# Patient Record
Sex: Male | Born: 1970 | Race: White | Hispanic: No | Marital: Single | Smoking: Current every day smoker
Health system: Southern US, Academic
[De-identification: ages and names within clinical notes are randomized; demographics above are authoritative.]

## PROBLEM LIST (undated history)

## (undated) DIAGNOSIS — G9340 Encephalopathy, unspecified: Secondary | ICD-10-CM

## (undated) DIAGNOSIS — F329 Major depressive disorder, single episode, unspecified: Secondary | ICD-10-CM

## (undated) DIAGNOSIS — B20 Human immunodeficiency virus [HIV] disease: Principal | ICD-10-CM

## (undated) DIAGNOSIS — Z21 Asymptomatic human immunodeficiency virus [HIV] infection status: Secondary | ICD-10-CM

## (undated) DIAGNOSIS — F32A Depression, unspecified: Secondary | ICD-10-CM

## (undated) DIAGNOSIS — F1990 Other psychoactive substance use, unspecified, uncomplicated: Secondary | ICD-10-CM

## (undated) HISTORY — PX: HX HERNIA REPAIR: SHX51

## (undated) HISTORY — DX: Human immunodeficiency virus (HIV) disease: B20

## (undated) HISTORY — PX: APPENDECTOMY: SHX54

## (undated) HISTORY — DX: Encephalopathy, unspecified: G93.40

## (undated) HISTORY — DX: Major depressive disorder, single episode, unspecified: F32.9

## (undated) HISTORY — DX: Asymptomatic human immunodeficiency virus (hiv) infection status: Z21

## (undated) HISTORY — DX: Depression, unspecified: F32.A

---
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
## 1997-12-07 ENCOUNTER — Emergency Department (HOSPITAL_COMMUNITY): Admission: EM | Admit: 1997-12-07 | Discharge: 1997-12-07 | Payer: Self-pay | Admitting: Emergency Medicine

## 1998-11-16 ENCOUNTER — Emergency Department (HOSPITAL_COMMUNITY): Admission: EM | Admit: 1998-11-16 | Discharge: 1998-11-16 | Payer: Self-pay | Admitting: Emergency Medicine

## 2000-06-18 ENCOUNTER — Emergency Department (HOSPITAL_COMMUNITY): Admission: EM | Admit: 2000-06-18 | Discharge: 2000-06-19 | Payer: Self-pay | Admitting: Emergency Medicine

## 2000-06-18 ENCOUNTER — Encounter: Payer: Self-pay | Admitting: Emergency Medicine

## 2004-06-22 ENCOUNTER — Ambulatory Visit (HOSPITAL_COMMUNITY): Admission: RE | Admit: 2004-06-22 | Discharge: 2004-06-22 | Payer: Self-pay | Admitting: Infectious Diseases

## 2004-06-22 ENCOUNTER — Ambulatory Visit: Payer: Self-pay | Admitting: Infectious Diseases

## 2004-06-29 ENCOUNTER — Ambulatory Visit: Payer: Self-pay | Admitting: Infectious Diseases

## 2004-07-02 ENCOUNTER — Ambulatory Visit: Payer: Self-pay | Admitting: Infectious Diseases

## 2004-07-10 ENCOUNTER — Ambulatory Visit: Payer: Self-pay | Admitting: Infectious Diseases

## 2004-07-13 ENCOUNTER — Inpatient Hospital Stay (HOSPITAL_COMMUNITY): Admission: EM | Admit: 2004-07-13 | Discharge: 2004-07-27 | Payer: Self-pay | Admitting: Emergency Medicine

## 2004-07-13 ENCOUNTER — Ambulatory Visit: Payer: Self-pay | Admitting: Internal Medicine

## 2004-07-13 ENCOUNTER — Ambulatory Visit: Payer: Self-pay | Admitting: Infectious Diseases

## 2004-07-19 ENCOUNTER — Ambulatory Visit: Payer: Self-pay | Admitting: Infectious Diseases

## 2004-08-10 ENCOUNTER — Ambulatory Visit: Payer: Self-pay | Admitting: Infectious Diseases

## 2004-08-12 ENCOUNTER — Ambulatory Visit (HOSPITAL_COMMUNITY): Admission: RE | Admit: 2004-08-12 | Discharge: 2004-08-12 | Payer: Self-pay | Admitting: Infectious Diseases

## 2004-08-28 ENCOUNTER — Encounter: Admission: RE | Admit: 2004-08-28 | Discharge: 2004-08-28 | Payer: Self-pay | Admitting: Otolaryngology

## 2004-08-31 ENCOUNTER — Ambulatory Visit: Payer: Self-pay | Admitting: Infectious Diseases

## 2004-09-01 ENCOUNTER — Other Ambulatory Visit: Admission: RE | Admit: 2004-09-01 | Discharge: 2004-09-01 | Payer: Self-pay | Admitting: Otolaryngology

## 2004-09-09 ENCOUNTER — Ambulatory Visit: Payer: Self-pay | Admitting: Infectious Diseases

## 2004-09-16 ENCOUNTER — Observation Stay (HOSPITAL_COMMUNITY): Admission: EM | Admit: 2004-09-16 | Discharge: 2004-09-17 | Payer: Self-pay | Admitting: Emergency Medicine

## 2004-09-16 ENCOUNTER — Ambulatory Visit: Payer: Self-pay | Admitting: Infectious Diseases

## 2004-09-17 ENCOUNTER — Encounter (INDEPENDENT_AMBULATORY_CARE_PROVIDER_SITE_OTHER): Payer: Self-pay | Admitting: Specialist

## 2004-10-14 ENCOUNTER — Ambulatory Visit (HOSPITAL_COMMUNITY): Admission: RE | Admit: 2004-10-14 | Discharge: 2004-10-14 | Payer: Self-pay | Admitting: Infectious Diseases

## 2004-10-14 ENCOUNTER — Ambulatory Visit: Payer: Self-pay | Admitting: Infectious Diseases

## 2004-10-19 ENCOUNTER — Encounter: Admission: RE | Admit: 2004-10-19 | Discharge: 2004-10-19 | Payer: Self-pay | Admitting: Otolaryngology

## 2004-11-23 ENCOUNTER — Ambulatory Visit: Payer: Self-pay | Admitting: Infectious Diseases

## 2005-03-10 ENCOUNTER — Ambulatory Visit (HOSPITAL_COMMUNITY): Admission: RE | Admit: 2005-03-10 | Discharge: 2005-03-10 | Payer: Self-pay | Admitting: Infectious Diseases

## 2005-03-10 ENCOUNTER — Ambulatory Visit: Payer: Self-pay | Admitting: Infectious Diseases

## 2005-03-24 ENCOUNTER — Ambulatory Visit: Payer: Self-pay | Admitting: Infectious Diseases

## 2005-06-22 ENCOUNTER — Ambulatory Visit (HOSPITAL_COMMUNITY): Admission: RE | Admit: 2005-06-22 | Discharge: 2005-06-22 | Payer: Self-pay | Admitting: Infectious Diseases

## 2005-06-22 ENCOUNTER — Ambulatory Visit: Payer: Self-pay | Admitting: Infectious Diseases

## 2005-07-05 ENCOUNTER — Ambulatory Visit: Payer: Self-pay | Admitting: Infectious Diseases

## 2005-09-30 ENCOUNTER — Encounter (INDEPENDENT_AMBULATORY_CARE_PROVIDER_SITE_OTHER): Payer: Self-pay | Admitting: *Deleted

## 2005-09-30 ENCOUNTER — Ambulatory Visit: Payer: Self-pay | Admitting: Infectious Diseases

## 2005-09-30 ENCOUNTER — Encounter: Admission: RE | Admit: 2005-09-30 | Discharge: 2005-09-30 | Payer: Self-pay | Admitting: Infectious Diseases

## 2005-09-30 LAB — CONVERTED CEMR LAB
CD4 Count: 100 microliters
HIV 1 RNA Quant: 49 copies/mL

## 2005-10-25 ENCOUNTER — Ambulatory Visit: Payer: Self-pay | Admitting: Infectious Diseases

## 2005-11-17 ENCOUNTER — Ambulatory Visit: Payer: Self-pay | Admitting: Infectious Diseases

## 2005-11-17 ENCOUNTER — Ambulatory Visit (HOSPITAL_COMMUNITY): Admission: RE | Admit: 2005-11-17 | Discharge: 2005-11-17 | Payer: Self-pay | Admitting: Infectious Diseases

## 2005-11-24 ENCOUNTER — Ambulatory Visit: Payer: Self-pay | Admitting: Infectious Diseases

## 2005-11-26 ENCOUNTER — Ambulatory Visit (HOSPITAL_COMMUNITY): Admission: RE | Admit: 2005-11-26 | Discharge: 2005-11-26 | Payer: Self-pay | Admitting: Infectious Diseases

## 2005-12-01 ENCOUNTER — Ambulatory Visit: Payer: Self-pay | Admitting: Infectious Diseases

## 2006-01-10 ENCOUNTER — Encounter (INDEPENDENT_AMBULATORY_CARE_PROVIDER_SITE_OTHER): Payer: Self-pay | Admitting: *Deleted

## 2006-01-10 ENCOUNTER — Encounter: Admission: RE | Admit: 2006-01-10 | Discharge: 2006-01-10 | Payer: Self-pay | Admitting: Infectious Diseases

## 2006-01-10 ENCOUNTER — Ambulatory Visit: Payer: Self-pay | Admitting: Infectious Diseases

## 2006-01-10 LAB — CONVERTED CEMR LAB
CD4 Count: 160 microliters
HIV 1 RNA Quant: 49 copies/mL

## 2006-01-27 ENCOUNTER — Ambulatory Visit: Payer: Self-pay | Admitting: Infectious Diseases

## 2006-06-06 ENCOUNTER — Encounter (INDEPENDENT_AMBULATORY_CARE_PROVIDER_SITE_OTHER): Payer: Self-pay | Admitting: *Deleted

## 2006-06-06 ENCOUNTER — Ambulatory Visit: Payer: Self-pay | Admitting: Infectious Diseases

## 2006-06-06 ENCOUNTER — Encounter: Admission: RE | Admit: 2006-06-06 | Discharge: 2006-06-06 | Payer: Self-pay | Admitting: Infectious Diseases

## 2006-06-06 LAB — CONVERTED CEMR LAB
ALT: 32 units/L (ref 0–53)
AST: 25 units/L (ref 0–37)
Albumin: 4.5 g/dL (ref 3.5–5.2)
Alkaline Phosphatase: 78 units/L (ref 39–117)
BUN: 13 mg/dL (ref 6–23)
Basophils Absolute: 0 10*3/uL (ref 0.0–0.1)
Basophils Relative: 0 % (ref 0–1)
CD4 Count: 110 microliters
CO2: 25 meq/L (ref 19–32)
Calcium: 9.4 mg/dL (ref 8.4–10.5)
Chloride: 102 meq/L (ref 96–112)
Creatinine, Ser: 0.94 mg/dL (ref 0.40–1.50)
Eosinophils Relative: 1 % (ref 0–4)
Glucose, Bld: 104 mg/dL — ABNORMAL HIGH (ref 70–99)
HCT: 39.9 % — ABNORMAL LOW (ref 41.0–49.0)
HIV 1 RNA Quant: 49 copies/mL
HIV 1 RNA Quant: <50 copies/mL (ref <50)
HIV-1 RNA Quant, Log: <1.7 (ref <1.70)
Hemoglobin: 13.9 g/dL (ref 13.9–16.8)
Lymphocytes Relative: 47 % — ABNORMAL HIGH (ref 15–43)
Lymphs Abs: 1.5 10*3/uL (ref 0.8–3.1)
MCHC: 34.8 g/dL (ref 33.1–35.4)
MCV: 120.2 fL — ABNORMAL HIGH (ref 78.8–100.0)
Monocytes Absolute: 0.4 10*3/uL (ref 0.2–0.7)
Monocytes Relative: 14 % — ABNORMAL HIGH (ref 3–11)
Neutro Abs: 1.3 10*3/uL — ABNORMAL LOW (ref 1.8–6.8)
Neutrophils Relative %: 39 % — ABNORMAL LOW (ref 47–77)
Platelets: 281 10*3/uL (ref 152–374)
Potassium: 4.2 meq/L (ref 3.5–5.3)
RBC: 3.32 M/uL — ABNORMAL LOW (ref 4.20–5.50)
RDW: 13.4 % (ref 11.5–15.3)
Sodium: 138 meq/L (ref 135–145)
Total Bilirubin: 0.9 mg/dL (ref 0.3–1.2)
Total Protein: 7.2 g/dL (ref 6.0–8.3)
WBC: 3.3 10*3/uL — ABNORMAL LOW (ref 3.7–10.0)

## 2006-06-20 ENCOUNTER — Ambulatory Visit: Payer: Self-pay | Admitting: Infectious Diseases

## 2006-07-05 IMAGING — CR DG CHEST 1V PORT
1 series · 1 of 1 positions shown · non-contrast
Comparison: Chest performed 07/15/04 at 3633 hours.

CLINICAL DATA: Central line placement. 
 PORTABLE CHEST, 07/15/04, AT 0700 HOURS:

[view not recorded]
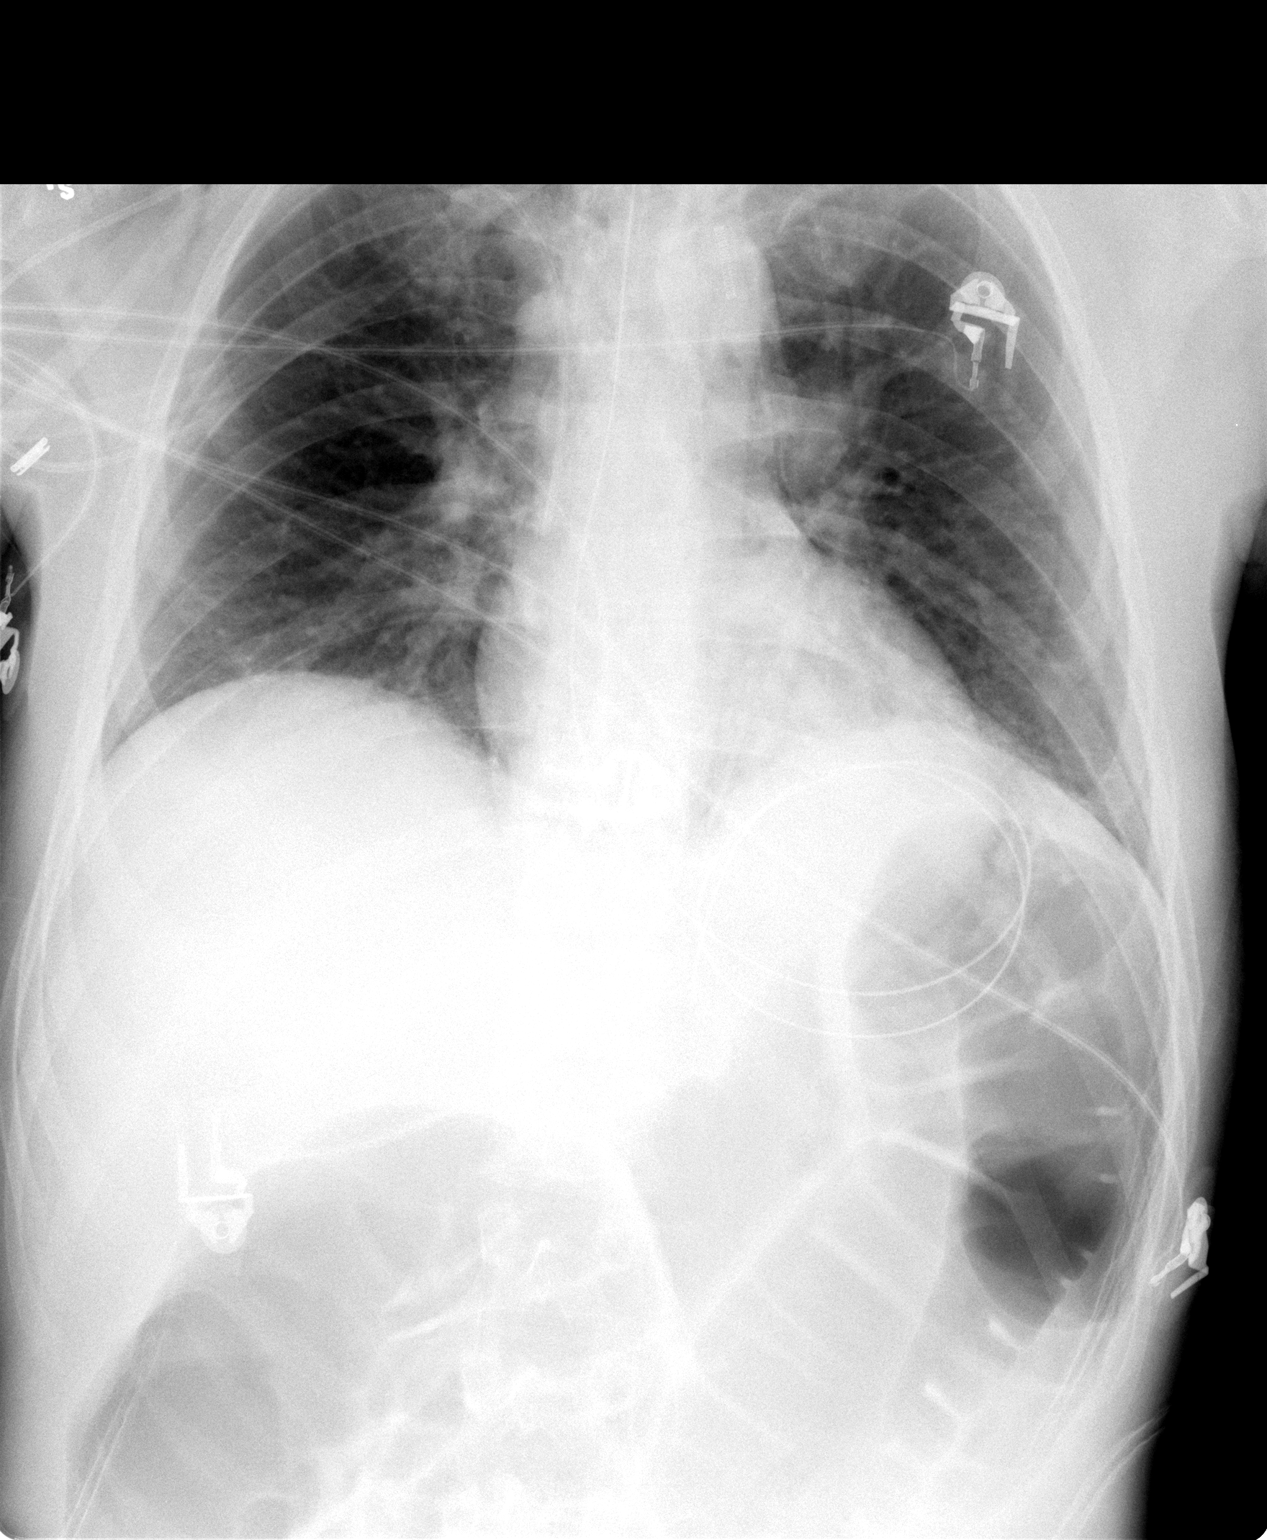

[1 of 1 positions shown; findings below may reference images not displayed]

NG tube has been placed with tip coiled in the stomach.  Endotracheal tube is present.  Right subclavian central venous catheter tip overlying the mid/lower SVC is noted without evidence of pneumothorax.  Right subcutaneous emphysema and pneumomediastinum are again noted.  Mild basilar atelectasis is present.  A distended gas-filled colon is noted.
IMPRESSION: 1.  Right central venous catheter without pneumothorax.  
 2.  NG tube placement. 
 3.  Increasing basilar atelectasis.
 4.  Stable subcutaneous emphysema and pneumomediastinum.

## 2006-07-05 IMAGING — CT CT CHEST W/O CM
1 series · 16 of 33 positions shown, 20 images · IV contrast (agent unspecified)
Comparison: none

CLINICAL DATA: Shortness of breath. Chest pain.  Pneumomediastinum and subcutaneous emphysema seen on chest x-ray earlier on 07/14/2004. 
 CHEST CT WITHOUT CONTRAST:
 The patient has an extensive pneumomediastinum with air extending into the subcutaneous tissues of both sides of the neck and in the axillae, right greater than left.  There is some air deep to the right pectoralis muscles.  However, there is no evidence of pneumothorax.  There are a few small patchy areas of density in the left lower lobe as well as in the right upper lobe posteriorly, which could represent tiny areas of infiltrate but more likely focal minimal atelectasis.  No significant adenopathy.

[Series 2: routine chest · axial · 0.70mm/px · z∈[-323,-38]mm · 16 of 63 slices shown, 20 images]
[im 3/63  mediastinal]
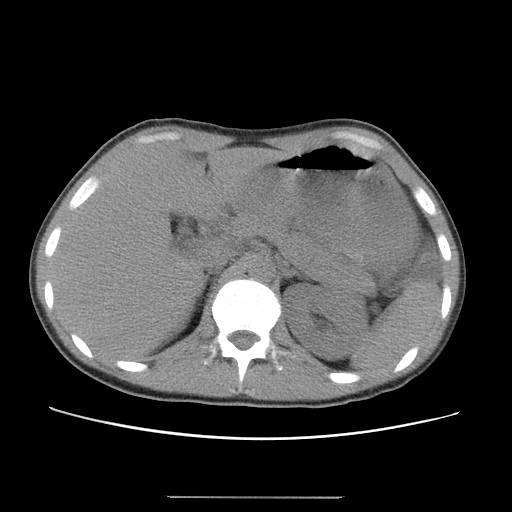
[im 3/63  lung]
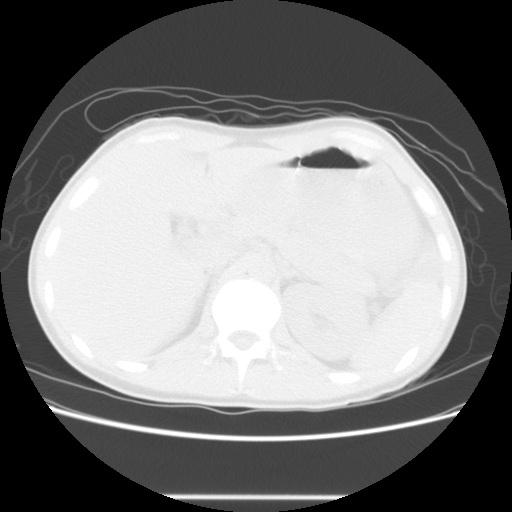
[im 7/63  lung]
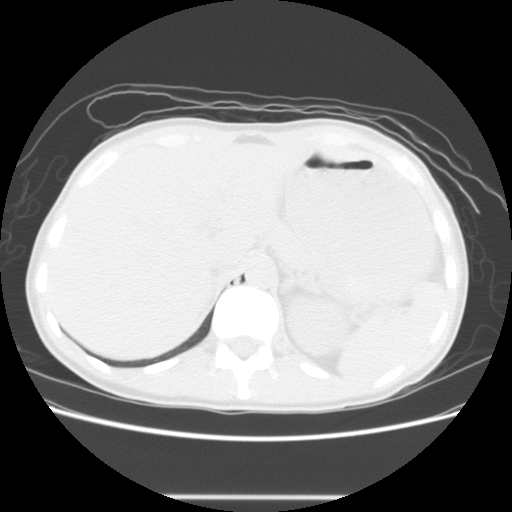
[im 12/63  lung]
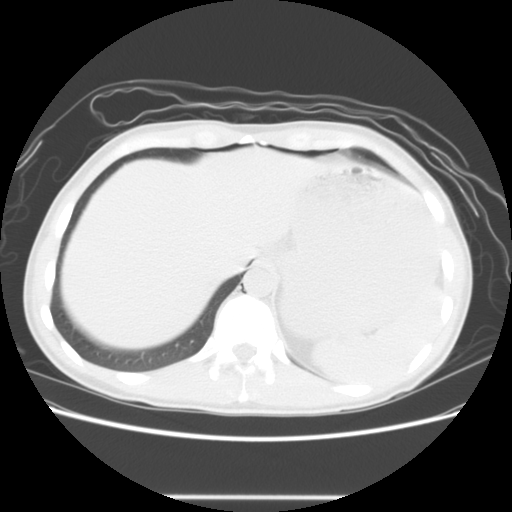
[im 14/63  lung]
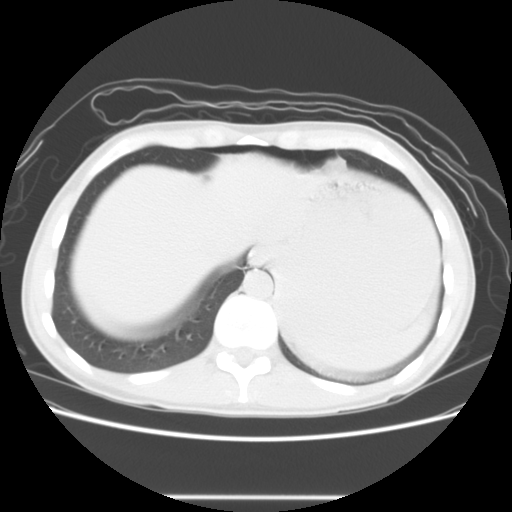
[im 19/63  mediastinal]
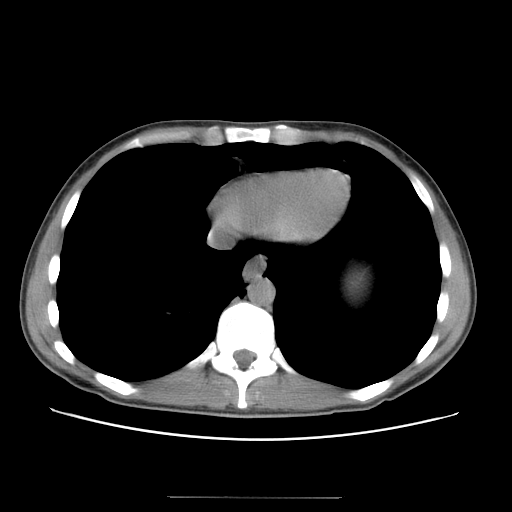
[im 19/63  lung]
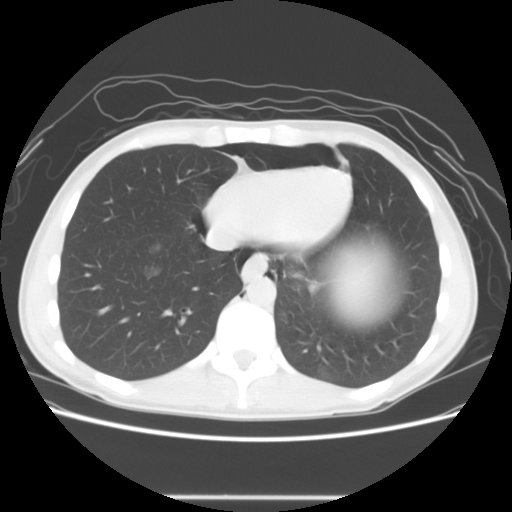
[im 23/63  lung]
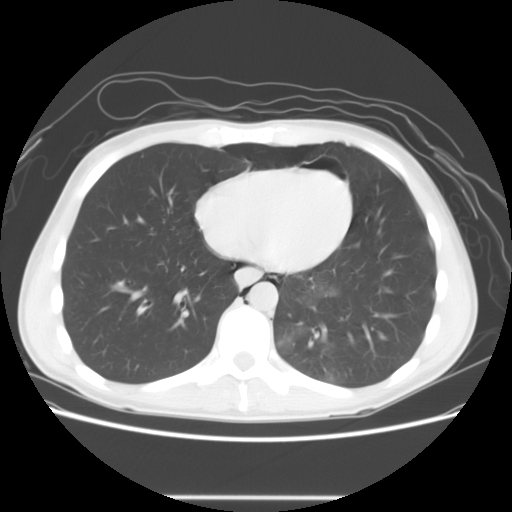
[im 26/63  lung]
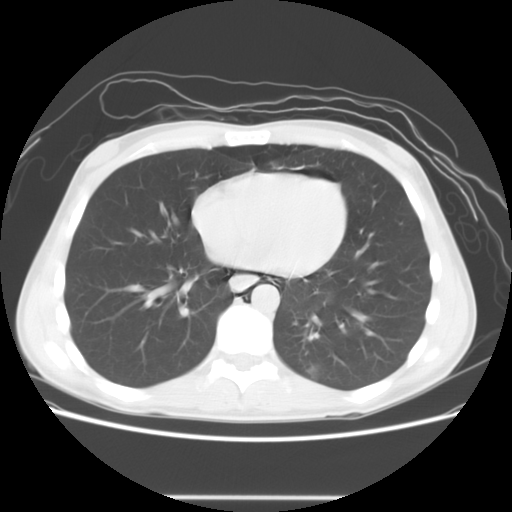
[im 30/63  lung]
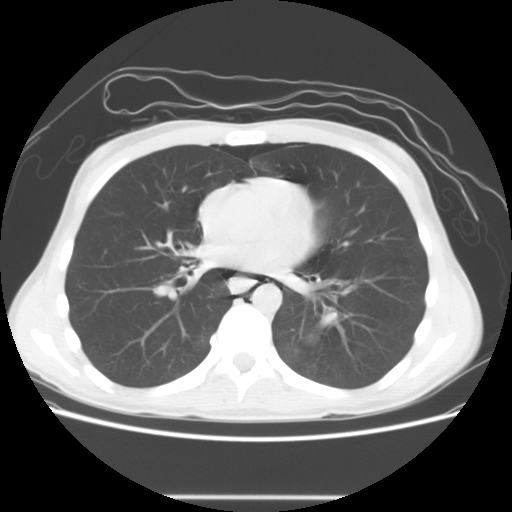
[im 34/63  mediastinal]
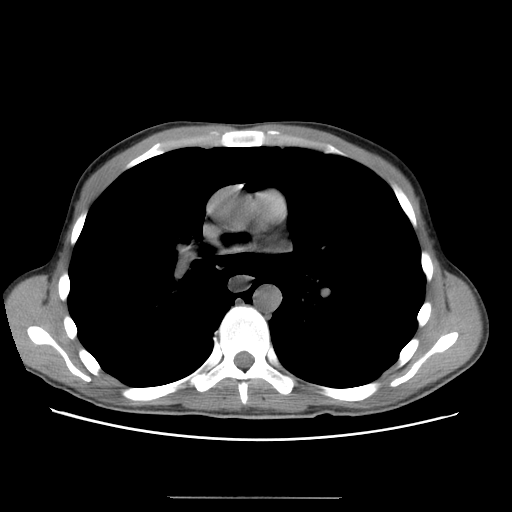
[im 34/63  lung]
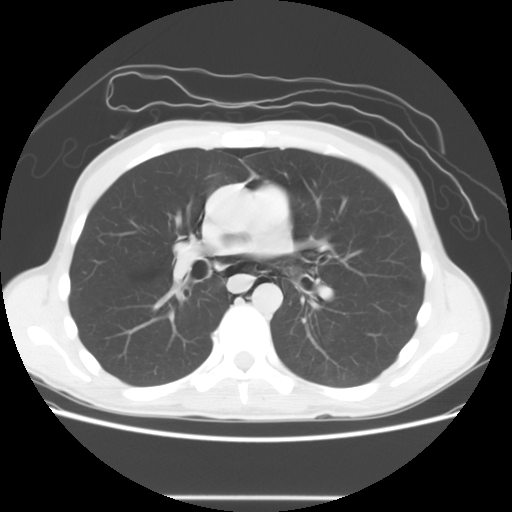
[im 37/63  lung]
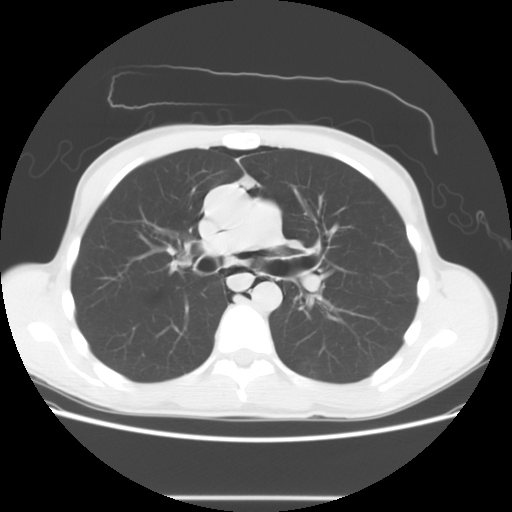
[im 40/63  lung]
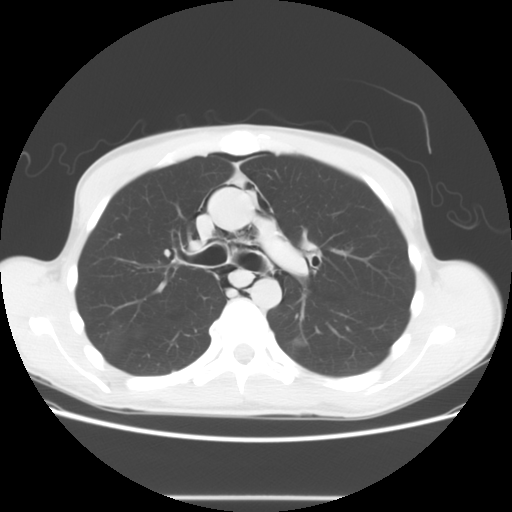
[im 44/63  lung]
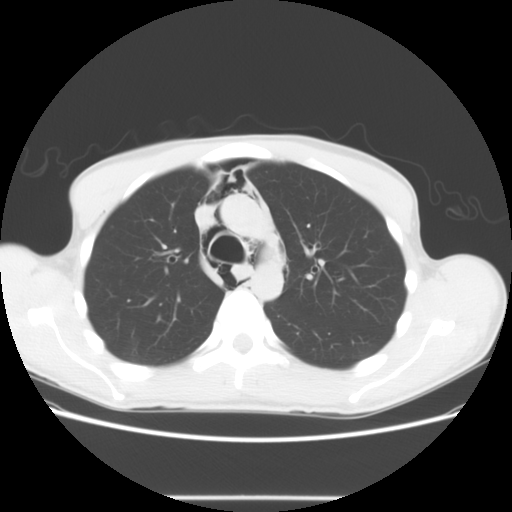
[im 49/63  mediastinal]
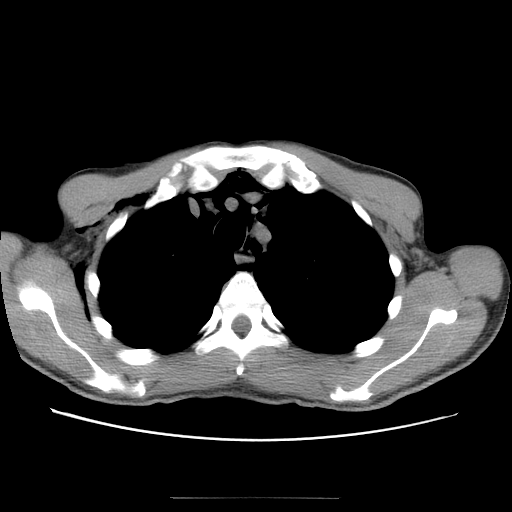
[im 49/63  lung]
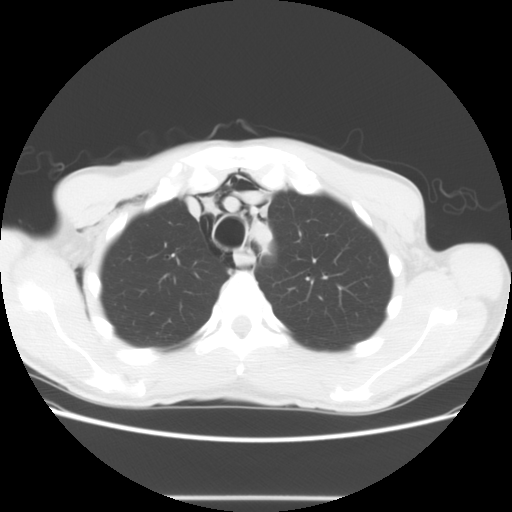
[im 51/63  lung]
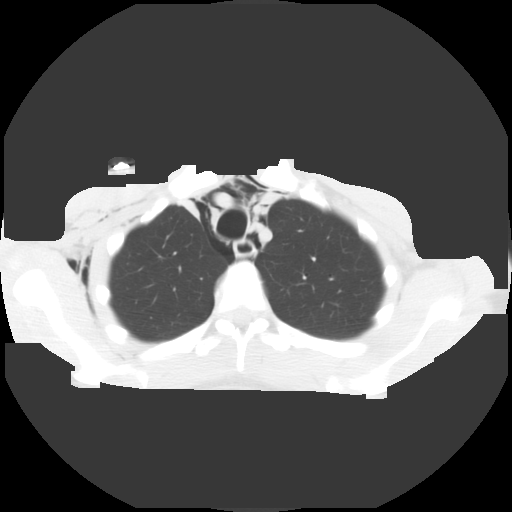
[im 56/63  lung]
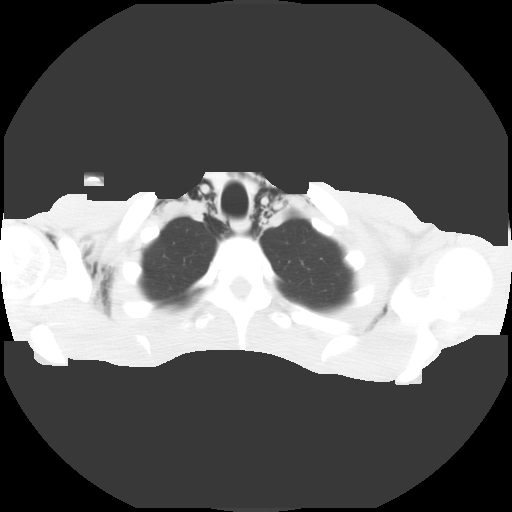
[im 60/63  lung]
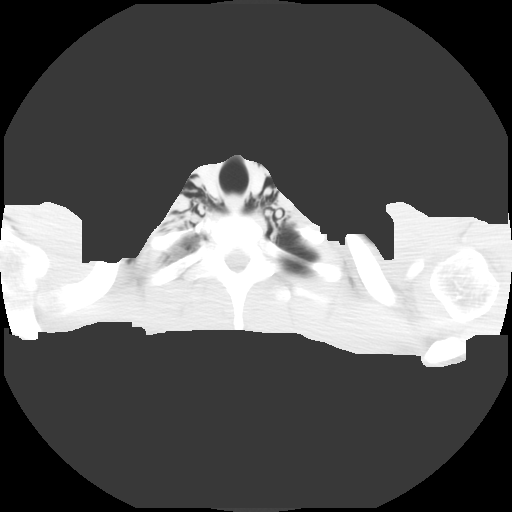

[16 of 33 positions shown; findings below may reference images not displayed]

IMPRESSION: Extensive pneumomediastinum with dissection of air into the neck and axillae.  This is of unknown etiology.

## 2006-07-06 IMAGING — CR DG CHEST 1V PORT
1 series · 1 of 1 positions shown · non-contrast
Comparison: 07/15/04.

CLINICAL DATA: Bibasilar atelectasis. Pneumomediastinum.  
 PORTABLE CHEST, 07/16/04:

[view not recorded]
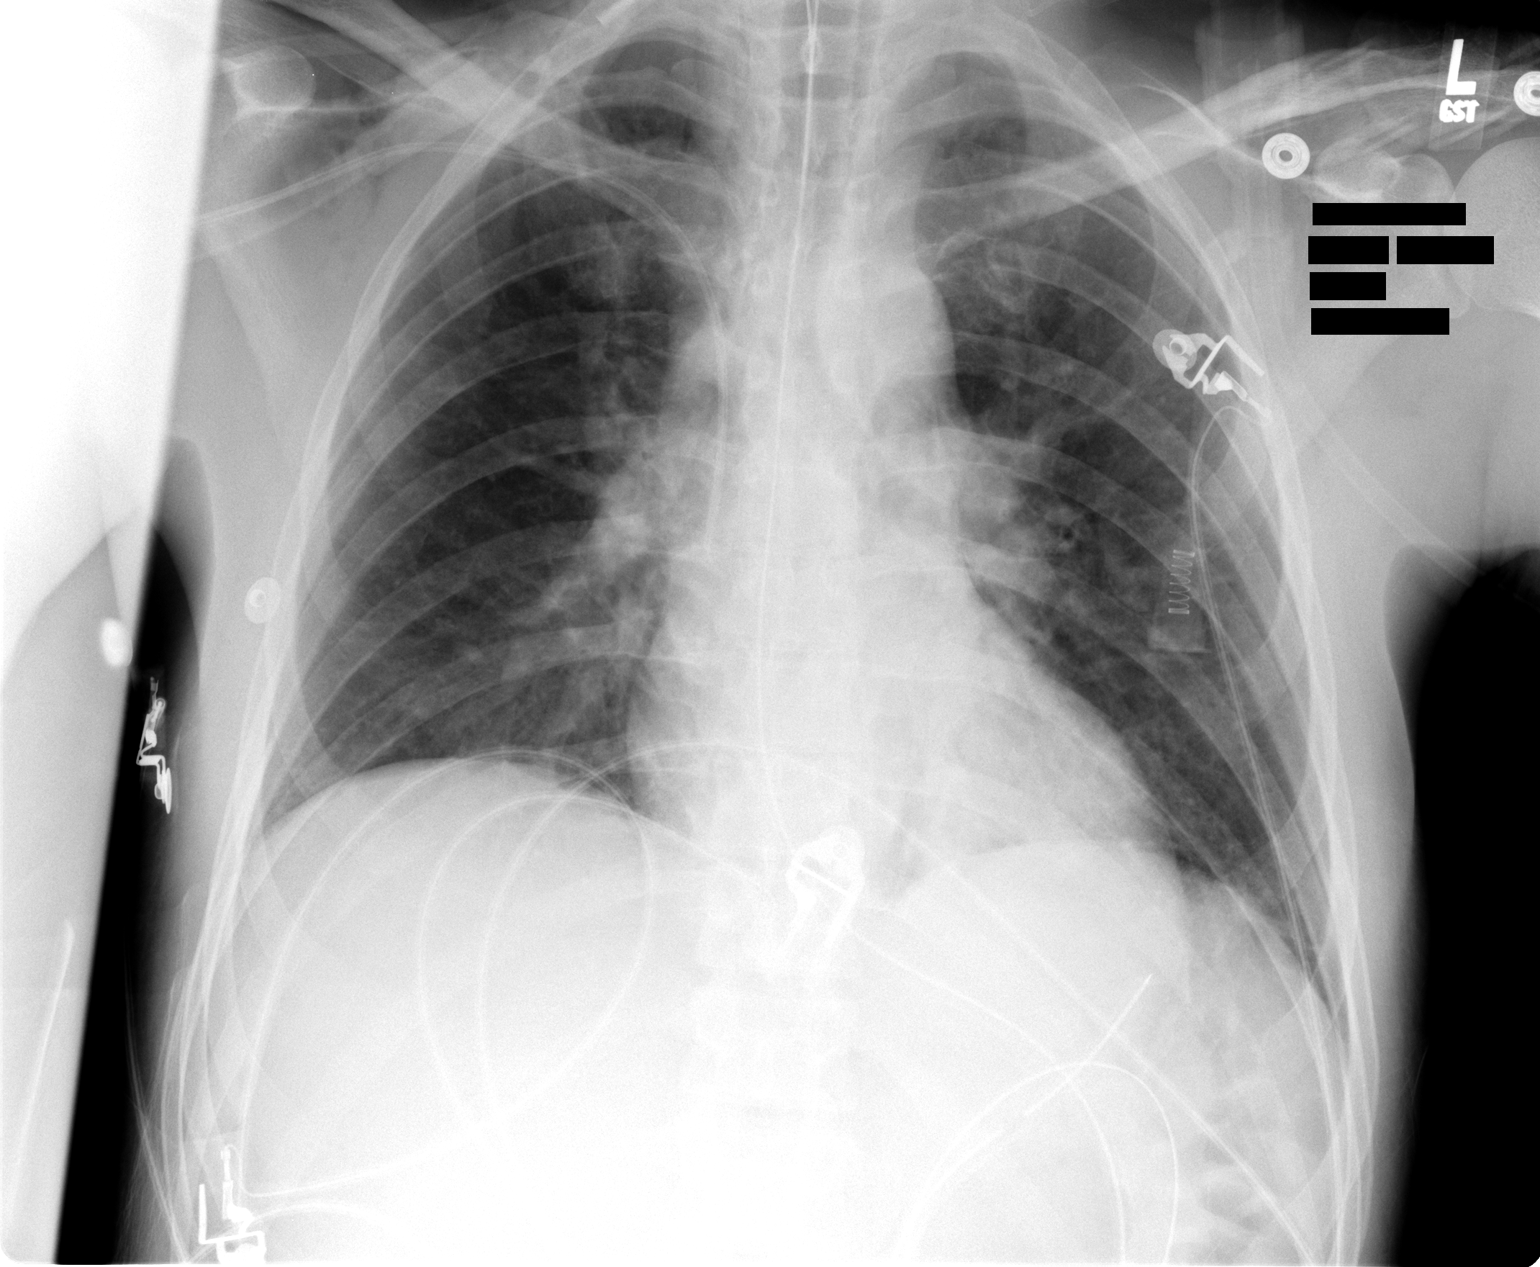

[1 of 1 positions shown; findings below may reference images not displayed]

Endotracheal tube, NG tube, and central venous catheter appear in good position.  Mild bibasilar atelectasis is almost completely resolved.  Vascularity and heart size are normal.  The subcutaneous and mediastinal emphysema has improved as well.
IMPRESSION: Improved atelectasis and mediastinal emphysema.

## 2006-07-25 ENCOUNTER — Ambulatory Visit: Payer: Self-pay | Admitting: Infectious Diseases

## 2006-08-05 DIAGNOSIS — B20 Human immunodeficiency virus [HIV] disease: Secondary | ICD-10-CM

## 2006-08-05 DIAGNOSIS — F0280 Dementia in other diseases classified elsewhere without behavioral disturbance: Secondary | ICD-10-CM | POA: Insufficient documentation

## 2006-09-12 ENCOUNTER — Encounter (INDEPENDENT_AMBULATORY_CARE_PROVIDER_SITE_OTHER): Payer: Self-pay | Admitting: *Deleted

## 2006-09-12 LAB — CONVERTED CEMR LAB

## 2006-09-22 ENCOUNTER — Telehealth (INDEPENDENT_AMBULATORY_CARE_PROVIDER_SITE_OTHER): Payer: Self-pay | Admitting: Infectious Diseases

## 2006-10-03 ENCOUNTER — Encounter: Admission: RE | Admit: 2006-10-03 | Discharge: 2006-10-03 | Payer: Self-pay | Admitting: Infectious Diseases

## 2006-10-03 ENCOUNTER — Ambulatory Visit: Payer: Self-pay | Admitting: Infectious Diseases

## 2006-10-03 LAB — CONVERTED CEMR LAB
ALT: 49 units/L (ref 0–53)
AST: 26 units/L (ref 0–37)
Albumin: 4.7 g/dL (ref 3.5–5.2)
Alkaline Phosphatase: 101 units/L (ref 39–117)
BUN: 19 mg/dL (ref 6–23)
Basophils Absolute: 0 10*3/uL (ref 0.0–0.1)
Basophils Relative: 1 % (ref 0–1)
Bilirubin Urine: NEGATIVE
CD4 Count: 140 microliters
CO2: 25 meq/L (ref 19–32)
Calcium: 9.8 mg/dL (ref 8.4–10.5)
Chloride: 102 meq/L (ref 96–112)
Cholesterol: 244 mg/dL — ABNORMAL HIGH (ref 0–200)
Creatinine, Ser: 1 mg/dL (ref 0.40–1.50)
Eosinophils Absolute: 0.1 10*3/uL (ref 0.0–0.7)
Eosinophils Relative: 1 % (ref 0–5)
Glucose, Bld: 106 mg/dL — ABNORMAL HIGH (ref 70–99)
HCT: 46.1 % (ref 39.0–52.0)
HDL: 57 mg/dL (ref >39)
HIV 1 RNA Quant: 88 copies/mL — ABNORMAL HIGH (ref <50)
HIV-1 RNA Quant, Log: 1.94 — ABNORMAL HIGH (ref <1.70)
Hemoglobin, Urine: NEGATIVE
Hemoglobin: 15.9 g/dL (ref 13.0–17.0)
Ketones, ur: NEGATIVE mg/dL
LDL Cholesterol: 160 mg/dL — ABNORMAL HIGH (ref 0–99)
Leukocytes, UA: NEGATIVE
Lymphocytes Relative: 52 % — ABNORMAL HIGH (ref 12–46)
Lymphs Abs: 2.2 10*3/uL (ref 0.7–3.3)
MCHC: 34.5 g/dL (ref 30.0–36.0)
MCV: 104.1 fL — ABNORMAL HIGH (ref 78.0–100.0)
Monocytes Absolute: 0.5 10*3/uL (ref 0.2–0.7)
Monocytes Relative: 12 % — ABNORMAL HIGH (ref 3–11)
Neutro Abs: 1.5 10*3/uL — ABNORMAL LOW (ref 1.7–7.7)
Neutrophils Relative %: 35 % — ABNORMAL LOW (ref 43–77)
Nitrite: NEGATIVE
Platelets: 250 10*3/uL (ref 150–400)
Potassium: 4.7 meq/L (ref 3.5–5.3)
Protein, ur: NEGATIVE mg/dL
RBC: 4.43 M/uL (ref 4.22–5.81)
RDW: 12.3 % (ref 11.5–14.0)
Sodium: 140 meq/L (ref 135–145)
Specific Gravity, Urine: 1.023 (ref 1.005–1.03)
Total Bilirubin: 0.6 mg/dL (ref 0.3–1.2)
Total CHOL/HDL Ratio: 4.3
Total Protein: 8 g/dL (ref 6.0–8.3)
Triglycerides: 134 mg/dL (ref <150)
Urine Glucose: NEGATIVE mg/dL
Urobilinogen, UA: 0.2 (ref 0.0–1.0)
VLDL: 27 mg/dL (ref 0–40)
WBC: 4.3 10*3/uL (ref 4.0–10.5)
pH: 6.5 (ref 5.0–8.0)

## 2006-10-17 ENCOUNTER — Telehealth (INDEPENDENT_AMBULATORY_CARE_PROVIDER_SITE_OTHER): Payer: Self-pay | Admitting: Infectious Diseases

## 2006-10-18 ENCOUNTER — Ambulatory Visit: Payer: Self-pay | Admitting: Internal Medicine

## 2006-10-31 ENCOUNTER — Ambulatory Visit: Payer: Self-pay | Admitting: Infectious Diseases

## 2007-05-02 ENCOUNTER — Telehealth: Payer: Self-pay | Admitting: Internal Medicine

## 2007-05-10 ENCOUNTER — Ambulatory Visit: Payer: Self-pay | Admitting: Internal Medicine

## 2007-05-10 ENCOUNTER — Encounter: Admission: RE | Admit: 2007-05-10 | Discharge: 2007-05-10 | Payer: Self-pay | Admitting: *Deleted

## 2007-05-10 ENCOUNTER — Encounter (INDEPENDENT_AMBULATORY_CARE_PROVIDER_SITE_OTHER): Payer: Self-pay | Admitting: *Deleted

## 2007-05-10 LAB — CONVERTED CEMR LAB
ALT: 46 units/L (ref 0–53)
AST: 28 units/L (ref 0–37)
Albumin: 4.6 g/dL (ref 3.5–5.2)
Alkaline Phosphatase: 102 units/L (ref 39–117)
BUN: 14 mg/dL (ref 6–23)
Basophils Absolute: 0 10*3/uL (ref 0.0–0.1)
Basophils Relative: 1 % (ref 0–1)
CO2: 25 meq/L (ref 19–32)
Calcium: 9.4 mg/dL (ref 8.4–10.5)
Chloride: 100 meq/L (ref 96–112)
Creatinine, Ser: 1.08 mg/dL (ref 0.40–1.50)
Eosinophils Absolute: 0.2 10*3/uL (ref 0.0–0.7)
Eosinophils Relative: 4 % (ref 0–5)
Glucose, Bld: 100 mg/dL — ABNORMAL HIGH (ref 70–99)
HCT: 43.5 % (ref 39.0–52.0)
HIV 1 RNA Quant: 53 copies/mL — ABNORMAL HIGH (ref <50)
HIV-1 RNA Quant, Log: 1.72 — ABNORMAL HIGH (ref <1.70)
Hemoglobin: 15 g/dL (ref 13.0–17.0)
Lymphocytes Relative: 46 % (ref 12–46)
Lymphs Abs: 2.6 10*3/uL (ref 0.7–3.3)
MCHC: 34.5 g/dL (ref 30.0–36.0)
MCV: 99.8 fL (ref 78.0–100.0)
Monocytes Absolute: 0.8 10*3/uL — ABNORMAL HIGH (ref 0.2–0.7)
Monocytes Relative: 14 % — ABNORMAL HIGH (ref 3–11)
Neutro Abs: 2 10*3/uL (ref 1.7–7.7)
Neutrophils Relative %: 35 % — ABNORMAL LOW (ref 43–77)
Platelets: 284 10*3/uL (ref 150–400)
Potassium: 3.9 meq/L (ref 3.5–5.3)
RBC: 4.36 M/uL (ref 4.22–5.81)
RDW: 13.8 % (ref 11.5–14.0)
Sodium: 136 meq/L (ref 135–145)
Total Bilirubin: 0.6 mg/dL (ref 0.3–1.2)
Total Protein: 7.5 g/dL (ref 6.0–8.3)
WBC: 5.7 10*3/uL (ref 4.0–10.5)

## 2007-05-30 ENCOUNTER — Ambulatory Visit: Payer: Self-pay | Admitting: Infectious Diseases

## 2007-05-30 DIAGNOSIS — A318 Other mycobacterial infections: Secondary | ICD-10-CM | POA: Insufficient documentation

## 2007-09-05 ENCOUNTER — Encounter: Admission: RE | Admit: 2007-09-05 | Discharge: 2007-09-05 | Payer: Self-pay | Admitting: Infectious Diseases

## 2007-09-05 ENCOUNTER — Ambulatory Visit: Payer: Self-pay | Admitting: Infectious Diseases

## 2007-09-05 LAB — CONVERTED CEMR LAB
ALT: 53 units/L (ref 0–53)
AST: 41 units/L — ABNORMAL HIGH (ref 0–37)
Albumin: 4.5 g/dL (ref 3.5–5.2)
Alkaline Phosphatase: 84 units/L (ref 39–117)
BUN: 11 mg/dL (ref 6–23)
Basophils Absolute: 0 10*3/uL (ref 0.0–0.1)
Basophils Relative: 1 % (ref 0–1)
CO2: 25 meq/L (ref 19–32)
Calcium: 9.5 mg/dL (ref 8.4–10.5)
Chloride: 104 meq/L (ref 96–112)
Creatinine, Ser: 1.07 mg/dL (ref 0.40–1.50)
Eosinophils Absolute: 0.2 10*3/uL (ref 0.0–0.7)
Eosinophils Relative: 4 % (ref 0–5)
Glucose, Bld: 104 mg/dL — ABNORMAL HIGH (ref 70–99)
HCT: 45.6 % (ref 39.0–52.0)
HIV 1 RNA Quant: <50 copies/mL (ref <50)
HIV-1 RNA Quant, Log: <1.7 (ref <1.70)
Hemoglobin: 15.5 g/dL (ref 13.0–17.0)
Lymphocytes Relative: 42 % (ref 12–46)
Lymphs Abs: 2.1 10*3/uL (ref 0.7–4.0)
MCHC: 34 g/dL (ref 30.0–36.0)
MCV: 99.8 fL (ref 78.0–100.0)
Monocytes Absolute: 0.6 10*3/uL (ref 0.1–1.0)
Monocytes Relative: 11 % (ref 3–12)
Neutro Abs: 2.1 10*3/uL (ref 1.7–7.7)
Neutrophils Relative %: 42 % — ABNORMAL LOW (ref 43–77)
Platelets: 283 10*3/uL (ref 150–400)
Potassium: 3.9 meq/L (ref 3.5–5.3)
RBC: 4.57 M/uL (ref 4.22–5.81)
RDW: 14.5 % (ref 11.5–15.5)
Sodium: 139 meq/L (ref 135–145)
Total Bilirubin: 0.7 mg/dL (ref 0.3–1.2)
Total Protein: 7.3 g/dL (ref 6.0–8.3)
WBC: 5 10*3/uL (ref 4.0–10.5)

## 2007-11-08 ENCOUNTER — Ambulatory Visit: Payer: Self-pay | Admitting: Internal Medicine

## 2007-11-08 DIAGNOSIS — B009 Herpesviral infection, unspecified: Secondary | ICD-10-CM

## 2007-11-09 LAB — CONVERTED CEMR LAB
Chlamydia, Swab/Urine, PCR: NEGATIVE
GC Probe Amp, Urine: NEGATIVE

## 2008-03-13 ENCOUNTER — Ambulatory Visit: Payer: Self-pay | Admitting: Infectious Diseases

## 2008-03-13 LAB — CONVERTED CEMR LAB
ALT: 34 units/L (ref 0–53)
AST: 25 units/L (ref 0–37)
Albumin: 4.8 g/dL (ref 3.5–5.2)
Alkaline Phosphatase: 74 units/L (ref 39–117)
BUN: 21 mg/dL (ref 6–23)
Basophils Absolute: 0 10*3/uL (ref 0.0–0.1)
Basophils Relative: 1 % (ref 0–1)
CO2: 24 meq/L (ref 19–32)
Calcium: 9.5 mg/dL (ref 8.4–10.5)
Chloride: 101 meq/L (ref 96–112)
Cholesterol: 265 mg/dL — ABNORMAL HIGH (ref 0–200)
Creatinine, Ser: 1.08 mg/dL (ref 0.40–1.50)
Eosinophils Absolute: 0.1 10*3/uL (ref 0.0–0.7)
Eosinophils Relative: 2 % (ref 0–5)
Glucose, Bld: 91 mg/dL (ref 70–99)
HCT: 43.7 % (ref 39.0–52.0)
HDL: 50 mg/dL (ref >39)
HIV 1 RNA Quant: 106 copies/mL — ABNORMAL HIGH (ref <50)
HIV-1 RNA Quant, Log: 2.03 — ABNORMAL HIGH (ref <1.70)
Hemoglobin: 15.7 g/dL (ref 13.0–17.0)
LDL Cholesterol: 152 mg/dL — ABNORMAL HIGH (ref 0–99)
Lymphocytes Relative: 37 % (ref 12–46)
Lymphs Abs: 2.1 10*3/uL (ref 0.7–4.0)
MCHC: 35.9 g/dL (ref 30.0–36.0)
MCV: 95.8 fL (ref 78.0–100.0)
Monocytes Absolute: 0.8 10*3/uL (ref 0.1–1.0)
Monocytes Relative: 13 % — ABNORMAL HIGH (ref 3–12)
Neutro Abs: 2.6 10*3/uL (ref 1.7–7.7)
Neutrophils Relative %: 47 % (ref 43–77)
Platelets: 282 10*3/uL (ref 150–400)
Potassium: 4 meq/L (ref 3.5–5.3)
RBC: 4.56 M/uL (ref 4.22–5.81)
RDW: 13.2 % (ref 11.5–15.5)
Sodium: 138 meq/L (ref 135–145)
Total Bilirubin: 0.9 mg/dL (ref 0.3–1.2)
Total CHOL/HDL Ratio: 5.3
Total Protein: 7.8 g/dL (ref 6.0–8.3)
Triglycerides: 314 mg/dL — ABNORMAL HIGH (ref <150)
VLDL: 63 mg/dL — ABNORMAL HIGH (ref 0–40)
WBC: 5.7 10*3/uL (ref 4.0–10.5)

## 2008-03-19 ENCOUNTER — Ambulatory Visit: Payer: Self-pay | Admitting: Internal Medicine

## 2008-03-19 ENCOUNTER — Telehealth (INDEPENDENT_AMBULATORY_CARE_PROVIDER_SITE_OTHER): Payer: Self-pay | Admitting: *Deleted

## 2008-03-19 DIAGNOSIS — R197 Diarrhea, unspecified: Secondary | ICD-10-CM

## 2008-03-27 ENCOUNTER — Ambulatory Visit: Payer: Self-pay | Admitting: Infectious Diseases

## 2008-04-16 ENCOUNTER — Ambulatory Visit: Payer: Self-pay | Admitting: Internal Medicine

## 2008-07-22 ENCOUNTER — Ambulatory Visit: Payer: Self-pay | Admitting: Internal Medicine

## 2008-09-09 ENCOUNTER — Ambulatory Visit: Payer: Self-pay | Admitting: Infectious Diseases

## 2008-09-09 LAB — CONVERTED CEMR LAB
ALT: 19 units/L (ref 0–53)
AST: 16 units/L (ref 0–37)
Albumin: 4.4 g/dL (ref 3.5–5.2)
Alkaline Phosphatase: 75 units/L (ref 39–117)
BUN: 11 mg/dL (ref 6–23)
Basophils Absolute: 0 10*3/uL (ref 0.0–0.1)
Basophils Relative: 0 % (ref 0–1)
CO2: 22 meq/L (ref 19–32)
Calcium: 9.2 mg/dL (ref 8.4–10.5)
Chloride: 107 meq/L (ref 96–112)
Creatinine, Ser: 1.02 mg/dL (ref 0.40–1.50)
Eosinophils Absolute: 0.1 10*3/uL (ref 0.0–0.7)
Eosinophils Relative: 2 % (ref 0–5)
Glucose, Bld: 87 mg/dL (ref 70–99)
HCT: 42.2 % (ref 39.0–52.0)
HIV 1 RNA Quant: <48 copies/mL (ref <48)
HIV-1 RNA Quant, Log: <1.68 (ref <1.68)
Hemoglobin: 14.2 g/dL (ref 13.0–17.0)
Hep A Total Ab: POSITIVE — AB
Lymphocytes Relative: 36 % (ref 12–46)
Lymphs Abs: 2.3 10*3/uL (ref 0.7–4.0)
MCHC: 33.6 g/dL (ref 30.0–36.0)
MCV: 97.7 fL (ref 78.0–100.0)
Monocytes Absolute: 0.8 10*3/uL (ref 0.1–1.0)
Monocytes Relative: 12 % (ref 3–12)
Neutro Abs: 3.2 10*3/uL (ref 1.7–7.7)
Neutrophils Relative %: 50 % (ref 43–77)
Platelets: 284 10*3/uL (ref 150–400)
Potassium: 4.2 meq/L (ref 3.5–5.3)
RBC: 4.32 M/uL (ref 4.22–5.81)
RDW: 13.9 % (ref 11.5–15.5)
Sodium: 142 meq/L (ref 135–145)
Total Bilirubin: 0.4 mg/dL (ref 0.3–1.2)
Total Protein: 7 g/dL (ref 6.0–8.3)
WBC: 6.4 10*3/uL (ref 4.0–10.5)

## 2008-09-17 ENCOUNTER — Ambulatory Visit: Payer: Self-pay | Admitting: Infectious Diseases

## 2008-09-17 DIAGNOSIS — E785 Hyperlipidemia, unspecified: Secondary | ICD-10-CM | POA: Insufficient documentation

## 2009-04-04 ENCOUNTER — Ambulatory Visit: Payer: Self-pay | Admitting: Internal Medicine

## 2009-04-04 DIAGNOSIS — R1031 Right lower quadrant pain: Secondary | ICD-10-CM | POA: Insufficient documentation

## 2009-11-18 ENCOUNTER — Ambulatory Visit: Payer: Self-pay | Admitting: Infectious Diseases

## 2009-11-18 DIAGNOSIS — J069 Acute upper respiratory infection, unspecified: Secondary | ICD-10-CM | POA: Insufficient documentation

## 2009-11-18 LAB — CONVERTED CEMR LAB
ALT: 21 units/L (ref 0–53)
AST: 23 units/L (ref 0–37)
Albumin: 4.6 g/dL (ref 3.5–5.2)
Alkaline Phosphatase: 95 units/L (ref 39–117)
BUN: 18 mg/dL (ref 6–23)
Basophils Absolute: 0.1 10*3/uL (ref 0.0–0.1)
Basophils Relative: 1 % (ref 0–1)
CO2: 27 meq/L (ref 19–32)
Calcium: 9.9 mg/dL (ref 8.4–10.5)
Chloride: 100 meq/L (ref 96–112)
Creatinine, Ser: 1.03 mg/dL (ref 0.40–1.50)
Eosinophils Absolute: 0.2 10*3/uL (ref 0.0–0.7)
Eosinophils Relative: 3 % (ref 0–5)
Glucose, Bld: 100 mg/dL — ABNORMAL HIGH (ref 70–99)
HCT: 42.4 % (ref 39.0–52.0)
HIV 1 RNA Quant: <48 copies/mL (ref <48)
HIV-1 RNA Quant, Log: <1.68 (ref <1.68)
Hemoglobin: 14.8 g/dL (ref 13.0–17.0)
Lymphocytes Relative: 34 % (ref 12–46)
Lymphs Abs: 2.4 10*3/uL (ref 0.7–4.0)
MCHC: 34.9 g/dL (ref 30.0–36.0)
MCV: 96.4 fL (ref 78.0–100.0)
Monocytes Absolute: 0.7 10*3/uL (ref 0.1–1.0)
Monocytes Relative: 11 % (ref 3–12)
Neutro Abs: 3.6 10*3/uL (ref 1.7–7.7)
Neutrophils Relative %: 52 % (ref 43–77)
Platelets: 312 10*3/uL (ref 150–400)
Potassium: 4.1 meq/L (ref 3.5–5.3)
RBC: 4.4 M/uL (ref 4.22–5.81)
RDW: 13.4 % (ref 11.5–15.5)
Sodium: 137 meq/L (ref 135–145)
Total Bilirubin: 0.5 mg/dL (ref 0.3–1.2)
Total Protein: 7.2 g/dL (ref 6.0–8.3)
WBC: 6.9 10*3/uL (ref 4.0–10.5)

## 2010-06-16 ENCOUNTER — Encounter (INDEPENDENT_AMBULATORY_CARE_PROVIDER_SITE_OTHER): Payer: Self-pay | Admitting: *Deleted

## 2010-07-07 ENCOUNTER — Encounter: Payer: Self-pay | Admitting: Infectious Disease

## 2010-07-07 ENCOUNTER — Ambulatory Visit: Payer: Self-pay | Admitting: Infectious Disease

## 2010-07-07 LAB — CONVERTED CEMR LAB
ALT: 30 units/L (ref 0–53)
AST: 26 units/L (ref 0–37)
Albumin: 4.4 g/dL (ref 3.5–5.2)
Alkaline Phosphatase: 78 units/L (ref 39–117)
BUN: 16 mg/dL (ref 6–23)
Basophils Absolute: 0.1 10*3/uL (ref 0.0–0.1)
Basophils Relative: 1 % (ref 0–1)
CO2: 26 meq/L (ref 19–32)
Calcium: 9.3 mg/dL (ref 8.4–10.5)
Chloride: 103 meq/L (ref 96–112)
Cholesterol: 188 mg/dL (ref 0–200)
Creatinine, Ser: 1.04 mg/dL (ref 0.40–1.50)
Eosinophils Absolute: 0.2 10*3/uL (ref 0.0–0.7)
Eosinophils Relative: 3 % (ref 0–5)
Glucose, Bld: 96 mg/dL (ref 70–99)
HCT: 40.8 % (ref 39.0–52.0)
HDL: 69 mg/dL (ref >39)
HIV 1 RNA Quant: <20 copies/mL (ref <20)
HIV-1 RNA Quant, Log: <1.3 (ref <1.30)
Hemoglobin: 14.1 g/dL (ref 13.0–17.0)
LDL Cholesterol: 91 mg/dL (ref 0–99)
Lymphocytes Relative: 34 % (ref 12–46)
Lymphs Abs: 1.8 10*3/uL (ref 0.7–4.0)
MCHC: 34.6 g/dL (ref 30.0–36.0)
MCV: 98.8 fL (ref 78.0–100.0)
Monocytes Absolute: 0.5 10*3/uL (ref 0.1–1.0)
Monocytes Relative: 10 % (ref 3–12)
Neutro Abs: 2.7 10*3/uL (ref 1.7–7.7)
Neutrophils Relative %: 51 % (ref 43–77)
Platelets: 284 10*3/uL (ref 150–400)
Potassium: 3.8 meq/L (ref 3.5–5.3)
RBC: 4.13 M/uL — ABNORMAL LOW (ref 4.22–5.81)
RDW: 13.4 % (ref 11.5–15.5)
Sodium: 139 meq/L (ref 135–145)
Total Bilirubin: 0.7 mg/dL (ref 0.3–1.2)
Total CHOL/HDL Ratio: 2.7
Total Protein: 6.9 g/dL (ref 6.0–8.3)
Triglycerides: 141 mg/dL (ref <150)
VLDL: 28 mg/dL (ref 0–40)
WBC: 5.3 10*3/uL (ref 4.0–10.5)

## 2010-07-29 ENCOUNTER — Ambulatory Visit
Admission: RE | Admit: 2010-07-29 | Discharge: 2010-07-29 | Payer: Self-pay | Source: Home / Self Care | Attending: Infectious Disease | Admitting: Infectious Disease

## 2010-07-29 DIAGNOSIS — R21 Rash and other nonspecific skin eruption: Secondary | ICD-10-CM | POA: Insufficient documentation

## 2010-08-18 NOTE — Miscellaneous (Signed)
  Clinical Lists Changes  Observations: Added new observation of YEARAIDSPOS: 2006  (06/16/2010 11:42)

## 2010-08-18 NOTE — Miscellaneous (Signed)
Summary: Orders Update  Clinical Lists Changes  Orders: Added new Test order of T-CBC w/Diff [phone]) - Signed Added new Test order of T-HIV Viral Load [phone]) - Signed Added new Test order of T-CD4SP [facility]) (CD4SP) - Signed Added new Test order of T-Comprehensive Metabolic Panel [phone]) - Signed

## 2010-08-18 NOTE — Assessment & Plan Note (Signed)
Summary: per Tommy Galvan ok to ob.[mkj]   Primary Provider:  Clydie Braun MD  CC:  congestion in chest x 2 weeks.  History of Present Illness: 40 yo with well controlled HIV.   Has been doing quite well.   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  No side effects.  Currently he has had a cold for 2.5 wks and congested in chest.  Has been sweating more but no fevers, chills.  Coughing improved with some mucinex.  Cough is non productive.  Sexually active with one partner.     Preventive Screening-Counseling & Management  Alcohol-Tobacco     Alcohol drinks/day: 0     Smoking Status: never     Passive Smoke Exposure: no  Caffeine-Diet-Exercise     Caffeine use/day: soda     Does Patient Exercise: yes     Type of exercise: jogging     Exercise (avg: min/session): 30-60     Times/week: 3  Safety-Violence-Falls     Seat Belt Use: yes   Prior Medication List:  EPZICOM 600-300 MG TABS (ABACAVIR SULFATE-LAMIVUDINE) Take 1 tablet by mouth once a day KALETRA 200-50 MG TABS (LOPINAVIR-RITONAVIR) Take 2 tablets by mouth two times a day VIAGRA 25 MG TABS (SILDENAFIL CITRATE) Take 1 tablet by mouth once a day as needed.   Current Allergies (reviewed today): ! 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  Not in a relationship.  occas sexually active with other males who have HIV and he does disclose his status but not  always using condoms  Risk Factors: Alcohol Use: 0 (11/18/2009) Caffeine Use: soda (11/18/2009) Exercise: yes (11/18/2009)  Risk Factors: Smoking Status: never (11/18/2009) Passive Smoke Exposure: no (11/18/2009)  Review of Systems       11 systems reviewed and negative except per HPI   Vital Signs:  Patient profile:   40 year old male Height:      71 inches (180.34 cm) Weight:      180.12 pounds (81.87 kg) BMI:     25.21 Temp:     98.2 degrees F (36.78 degrees C) oral Pulse rate:   67 / minute BP sitting:   131 / 73  (left arm)  Vitals Entered By: Baxter Hire) (Nov 18, 2009 2:21 PM) CC: congestion in chest x 2 weeks Pain Assessment Patient in pain? no      Nutritional Status BMI of 25 - 29 = overweight Nutritional Status Detail appetite is good per patient  Have you ever been in a relationship where you felt threatened, hurt or afraid?No   Does patient need assistance? Functional Status Self care Ambulation Normal   Physical Exam  General:  alert, well-developed, and well-nourished.   Head:  normocephalic, atraumatic, and no abnormalities observed.   Eyes:  vision grossly intact, pupils equal, and pupils round.   Ears:  R ear normal and L ear normal.   Nose:  no external deformity.   Mouth:  fair dentition.   Neck:  supple.   Lungs:  normal respiratory effort and normal breath sounds.   Heart:  normal rate and regular  rhythm.   Abdomen:  soft and no masses.   Msk:  normal ROM and no joint tenderness.   Extremities:  no cce Neurologic:  alert & oriented X3.   Skin:  no rashes.   Cervical Nodes:  no anterior cervical adenopathy and no posterior cervical adenopathy.   Psych:  Oriented X3, memory intact for recent and remote, and normally interactive.          Medication Adherence: 11/18/2009   Adherence to medications reviewed with patient. Counseling to provide adequate adherence provided   Prevention For Positives: 11/18/2009   Safe sex practices  discussed with patient. Condoms offered.                             Impression & Recommendations:  Problem # 1:  HIV DISEASE (ICD-042)  Doing great.  BW today and follow up as needed.  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 2 by mouth once daily the first day then one a day for 4 days  Problem # 2:  URI (ICD-465.9) z pack given prolonged course and no benefit with otc sxs  Problem # 3:  HYPERLIPIDEMIA (ICD-272.4) will check cholesterol at next visit since not fasting.  Has lost 35 #s   Labs Reviewed: SGOT: 16 (09/09/2008)   SGPT: 19 (09/09/2008)   HDL:50 (03/13/2008), 57 (10/03/2006)  LDL:152 (03/13/2008), 160 (10/03/2006)  Chol:265 (03/13/2008), 244 (10/03/2006)  Trig:314 (03/13/2008), 134 (10/03/2006)  Medications Added to Medication List This Visit: 1)  Zithromax 250 Mg Tabs (Azithromycin) .... 2 by mouth once daily the first day then one a day for 4 days  Other Orders: T-RPR (Syphilis) ([id number]) Est. Patient Level IV (08657) Future Orders: T-CD4SP ([facility]) (CD4SP) ... 08/15/2010 T-HIV Viral Load [id number]) ... 08/15/2010 T-CBC w/Diff ([id number]) ... 08/15/2010 T-Comprehensive Metabolic Panel [id number]) ... 08/15/2010 T-Lipid Profile [phone]) ... 08/15/2010  Patient Instructions: 1)  Please schedule a follow-up appointment in 9 months with Brad. 2)  Call in 2 weeks for your lab results. 3)  If cough persists more than 2 more weeks please call to be seen  Prescriptions: ZITHROMAX 250 MG TABS (AZITHROMYCIN) 2 by mouth once daily the first day then one a day for 4 days  #6 x 0   Entered and Authorized by:   Clydie Braun MD   Signed  by:   Clydie Braun MD on 11/18/2009   Method used:   Print then Give to Patient   RxID:   [id number]  Process Orders Check Orders Results:     [facility]: ABN not required for this insurance Tests Sent for requisitioning (Nov 18, 2009 3:33 PM):     11/18/2009: [facility] -- T-RPR (Syphilis) [phone] (signed)     08/15/2010: [facility] -- T-HIV Viral Load [phone] (signed)     08/15/2010: [facility] -- T-CBC w/Diff [[id number]] (signed)     08/15/2010: [facility] -- T-Comprehensive Metabolic Panel [[id number]] (signed)     08/15/2010: [facility] -- T-Lipid Profile [phone] (signed)

## 2010-08-20 NOTE — Assessment & Plan Note (Signed)
Summary: TRANSFER PT DR FITZGERALD/VS   Visit Type:  New Patient Primary Provider:  Paulette Blanch Dam  CC:  transfer pt from Dr. Sampson Goon.  History of Present Illness: 40 year old with HIV superbly well controlled on kaletra and epzicom. He had previously failed sustiva/truvada and was changed to combivir/kaletra, then epzicom/kaletra by Dr Roxan Hockey and has remained on this regimen for more than 3 years awith viral suppression and healhty cd4 count. He has had evanscent rash on his upper back and inquires about this. He has no toher compliants. He had flu shot at work.   Current Allergies (reviewed today): ! 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  Not in a relationship.  occas sexually active with other males who have HIV and he does disclose his status but not always using condoms  Risk Factors: Alcohol Use: 0 (11/18/2009) Caffeine Use: soda (11/18/2009) Exercise: yes (11/18/2009)  Risk Factors: Smoking Status: never (11/18/2009) Passive Smoke Exposure: no (11/18/2009)  Review of Systems       The patient complains of suspicious skin lesions.  The patient denies anorexia, fever, weight loss, weight gain, vision loss, decreased hearing, hoarseness, chest pain, syncope, dyspnea on exertion, peripheral edema, prolonged cough, headaches, hemoptysis, abdominal pain, melena, hematochezia, severe indigestion/heartburn, hematuria, incontinence, genital sores,  muscle weakness, transient blindness, difficulty walking, depression, unusual weight change, abnormal bleeding, enlarged lymph nodes, and angioedema.    Vital Signs:  Patient profile:   40 year old male Height:      71 inches (180.34 cm) Weight:      188 pounds (85.45 kg) BMI:     26.32 Temp:     73 degrees F (22.78 degrees C) oral Pulse rate:   73 / minute BP sitting:   119 / 73  (left arm)  Vitals Entered By: Starleen Arms CMA (July 29, 2010 11:35 AM) CC: transfer pt from Dr. Sampson Goon Is Patient Diabetic? No Pain Assessment Patient in pain? no      Nutritional Status BMI of 25 - 29 = overweight  Does patient need assistance? Functional Status Self care Ambulation Normal   Physical Exam  General:  alert, well-developed, and well-nourished.   Head:  normocephalic and atraumatic.   Eyes:  vision grossly intact.   Ears:  no external deformities.   Nose:  no external deformity and no external erythema.   Mouth:  pharynx pink and moist, no erythema, and no exudates.   Neck:  supple and full ROM.   Lungs:  normal respiratory effort, no crackles, and no wheezes.   Heart:  normal rate, regular rhythm, no murmur, and no gallop.   Abdomen:  soft, non-tender, and normal bowel sounds.   Msk:  normal ROM and no joint deformities.   Extremities:  No clubbing, cyanosis, edema, or deformity noted with normal full range of motion of all joints.   Neurologic:  alert & oriented X3.  strength normal in all extremities, sensation intact to light touch, and gait normal.   Skin:  few freckles and acneiform lesion on back, nothing is suspicous for malignancy Psych:  Oriented X3, memory intact for recent and remote, and normally interactive.  Medication Adherence: 07/29/2010   Adherence to medications reviewed with patient. Counseling to provide adequate adherence provided   Prevention For Positives: 07/29/2010   Safe sex practices discussed with patient. Condoms offered.                              Impression & Recommendations:  Problem # 1:  HIV DISEASE (ICD-042)  superb control. He can take the [place] four tables once daily The following medications were removed from the medication list:    Zithromax 250 Mg Tabs (Azithromycin) .Marland Kitchen... 2 by mouth once daily the first day then one a day for 4 days  Orders: New Patient Level IV (66440)  Problem # 2:  SKIN RASH (ICD-782.1)  was evanescent. Will refer him to Dermatology. He is light skinnned and can benefit from regular skin exams  Orders: New Patient Level IV (34742)  Problem # 3:  HYPERLIPIDEMIA (ICD-272.4)  At goal Labs Reviewed: SGOT: 26 (07/07/2010)   SGPT: 30 (07/07/2010)   HDL:69 (07/07/2010), 50 (03/13/2008)  LDL:91 (07/07/2010), 152 (59/56/3875)  Chol:188 (07/07/2010), 265 (03/13/2008)  Trig:141 (07/07/2010), 314 (03/13/2008)  Orders: New Patient Level IV (64332)  Other Orders: T-GC Probe, urine ([id number]) T-Chlamydia  Probe, urine [phone] Dermatology Referral ([facility]) Pneumococcal Vaccine (23557) Admin 1st Vaccine (32202) Future Orders: T-CD4SP ([facility]) (CD4SP) ... 01/25/2011 T-HIV Viral Load [phone]) ... 01/25/2011 T-CBC w/Diff ([id number]) ... 01/25/2011 T-RPR (Syphilis) [phone]) ... 01/25/2011 T-Lipid Profile [phone]) ... 01/25/2011 T-Comprehensive Metabolic Panel [phone]) ... 01/25/2011  Patient Instructions: 1)  Please schedule a follow-up appointment in 6 months. 2)  Be sure to return for lab work one (1) week before your next appointment as scheduled.    Immunization History:  Influenza Immunization History:    Influenza:  historical (04/18/2010)  Immunizations Administered:  Pneumonia Vaccine:    Vaccine Type: Pneumovax    Site: left deltoid    Mfr: Merck    Dose: 0.5 ml    Route: IM    Given by: Starleen Arms CMA    Exp. Date: 11/26/2011    Lot #: [id number]    VIS given: 06/23/09 version given July 29, 2010. Prescriptions: KALETRA 200-50 MG TABS (LOPINAVIR-RITONAVIR) Take 2 tablets by mouth two times a day  #360 x 3   Entered by:   Starleen Arms CMA   Authorized by:   Acey Lav MD   Signed by:   Starleen Arms CMA on 07/29/2010   Method used:   Faxed to ...       [name] (mail-order)       [address]       [place], [place]  [address]       Ph: [phone]       Fax: [phone]   RxID:   [id number] EPZICOM 600-300 MG TABS (ABACAVIR SULFATE-LAMIVUDINE) Take 1 tablet by mouth once a day  #90 x 4   Entered by:   Starleen Arms CMA   Authorized by:   Acey Lav MD   Signed by:   Starleen Arms CMA on 07/29/2010   Method used:   Faxed to ...       [facility] (mail-order)       [address]       [place], [place]  [address]       Ph: [phone]       Fax: [phone]  RxID:   [id number] KALETRA 200-50 MG TABS (LOPINAVIR-RITONAVIR) Take 2 tablets by mouth two times a day  #360 x 3   Entered by:   Starleen Arms CMA   Authorized by:   Acey Lav MD   Signed by:   Starleen Arms CMA on 07/29/2010   Method used:   Printed then faxed to ...       [facility] (mail-order)       [address]       [place], [place]  [address]       Ph: [phone]       Fax: [phone]   RxID:   [id number] EPZICOM 600-300 MG TABS (ABACAVIR SULFATE-LAMIVUDINE) Take 1 tablet by mouth once a day  #90 x 4   Entered by:   Starleen Arms CMA   Authorized by:   Acey Lav MD   Signed by:   Starleen Arms CMA on 07/29/2010   Method used:   Printed then faxed to ...       [facility] (mail-order)       [address]       [place], [place]  [address]       Ph: [phone]       Fax: [phone]   RxID:   [id number]

## 2010-09-28 LAB — T-HELPER CELL (CD4) - (RCID CLINIC ONLY)
CD4 % Helper T Cell: 16 % — ABNORMAL LOW (ref 33–55)
CD4 T Cell Abs: 320 uL — ABNORMAL LOW (ref 400–2700)

## 2010-10-06 LAB — T-HELPER CELL (CD4) - (RCID CLINIC ONLY)
CD4 % Helper T Cell: 14 % — ABNORMAL LOW (ref 33–55)
CD4 T Cell Abs: 320 uL — ABNORMAL LOW (ref 400–2700)

## 2010-11-03 LAB — T-HELPER CELL (CD4) - (RCID CLINIC ONLY)
CD4 % Helper T Cell: 12 % — ABNORMAL LOW (ref 33–55)
CD4 T Cell Abs: 270 uL — ABNORMAL LOW (ref 400–2700)

## 2010-12-04 NOTE — Discharge Summary (Signed)
Tommy Galvan, Tommy Galvan NO.:  [id number]   MEDICAL RECORD NO.:  [id number]          PATIENT TYPE:  INP   LOCATION:  5031                         FACILITY:  [facility]   PHYSICIAN:  Elliot Cousin, M.D.         DATE OF BIRTH:   DATE OF ADMISSION:  07/13/2004  DATE OF DISCHARGE:  07/27/2004                                 DISCHARGE SUMMARY   DISCHARGE DIAGNOSES:  1.  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  2.  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  3.  Systemic inflammatory response syndrome.  4.  Coagulopathy.  5.  Hyperchloremic acidosis with electrolyte derangements (hypernatremia and      hypokalemia).  6.  Respiratory failure requiring mechanical intubation and ventilation.  7.  Spontaneous extensive pneumomediastinum with dissection of air into the      neck and axilla.  8.  Streptococcal pneumoniae pneumonia.  9.  Cachexia/malnutrition.  10. Paroxysmal supraventricular tachycardia, converted with metoprolol.  11. Normocytic anemia.  12. Labial herpetic lesion   DISCHARGE MEDICATIONS:  1.  Kaletra two pills b.i.d.  2.  Combivir one pill twice daily.  3.  Azithromycin 600 mg two pills every Monday.  4.  Imodium as needed.  5.  Multivitamin with iron once daily.   DISPOSITION:  The anticipated date of discharge is on July 27, 2004. The  patient was advised by Dr. Lenn Sink to follow up with him in the  infectious diseases clinic in 2 weeks.   CONSULTATIONS:  1.  Infectious diseases.  2.  [facility].  3.  Stan Head, M.D.   PROCEDURES PERFORMED:  1.  Intubation and ventilation July 15, 2004. Extubated July 16, 2004.  2.  CT scan of the chest on July 14, 2004. The results revealed      extensive pneumomediastinum with dissection of air into the neck and      axilla.  3.  Central  line inserted July 15, 2004 and discontinued July 22, 2004.   HISTORY OF PRESENT ILLNESS:  The patient is a 40 year old man with a past  medical history significant for HIV/AIDS which was diagnosed 1 month prior  to admission. Apparently, he presented to his primary care physician with a  chief complaint of diarrhea 1 month ago. At that time, he was diagnosed with  HIV given that his CD-4 count was less than 10 and his viral load was  approximately 300,000. The patient was then started on Sustiva and Truvada.  He underwent an outpatient colonoscopy by Sharrell Ku, M.D. He was given  a diagnosis of Cryptosporidiosis. He was then started on Flagyl and Cipro  for approximately 7 days. His symptoms did not resolve. He was subsequently  started on Alinia. His symptoms persisted and he; therefore, presented to  the emergency department on July 13, 2004. When the patient was  initially evaluated in the  emergency department, he was found to have a pH  of 7.184, a bicarb of 11.9, and a potassium level of 2.6. The patient was,  therefore, admitted for further evaluation and management.   HOSPITAL COURSE:  Problem 1. SEVERE DIARRHEA THOUGHT TO BE SECONDARY TO  CRYPTOSPORIDIOSIS. The patient was started on aggressive repletion with  normal saline initially. He was given 1 amp of sodium bicarbonate x1 via an  IV push. His potassium was treated with intravenous and oral potassium.  Stool specimens were collected for study. The initial results were  essentially negative. The C. difficile toxin x2 was negative. The fecal WBCs  were also negative. The stool ova and parasite was also negative. During the  first few days of hospitalization, the patient had copious amounts of watery  and loose diarrhea, sometimes measuring up to 1 to 2 L of volume daily. He  was continued on aggressive fluid repletion with potassium chloride added  and bicarbonate added. A rectal tube was administered  secondary to the  copious amounts of diarrhea. Gastroenterologist, Dr. Leone Payor, was consulted  for further evaluation and recommendations. Dr. Leone Payor recommended  Pedialyte four times daily and standing doses of Imodium or Lomotil. The  patient was started on standing doses of Imodium four times daily. The  Alinia which was initially started on admission was discontinued because it  was felt by the infectious diseases physician, Dr. Ninetta Lights, that it could  potentially worsen diarrhea. The patient's diarrhea eventually subsided and  more or less resolved during the hospital course. The Imodium was only  required as a p.r.n. dosing at the time of hospital discharge. The patient's  BMs are now  down to one or two semi-formed stools daily.   Problem 2. SYSTEMIC INFLAMMATORY RESPONSE SYNDROME/HYPERCHLOREMIC ACIDOSIS  WITH ELECTROLYTE ABNORMALITIES/RESPIRATORY FAILURE. As stated above, the  patient was quite acidotic when he was evaluated in the emergency  department. His serum bicarbonate was approximately 12 and his pH was 7.18.  As stated above, the patient was started on aggressive treatment to correct  the acidosis and the electrolyte abnormalities. He was treated with IV  solutions ranging from 1/2 normal saline to 1/4 normal saline with  bicarbonate and potassium chloride added. On hospital day #2, the patient  developed respiratory distress. His oxygen saturations fell into the 80s on  room air. The patient was treated with oxygen therapy and transferred to the  ICU. A STAT chest x-ray revealed subcutaneous emphysema and a  pneumomediastinum could not be excluded. Subsequently, a CT scan of the  chest was ordered for further clarification. The CT scan of the chest  revealed extensive pneumomediastinum with dissection of air into the neck  and axilla. [facility] was consulted and Dr. Sherene Sires  provided the initial evaluation and management. The patient did  require intubation and mechanical ventilation under the supervision of Dr. Sherene Sires.  Blood cultures were ordered as well as sputum cultures. The blood cultures  remained negative during the hospital course. The pneumocystis culture/smear  was negative. The AFB smear was negative although the culture is still  pending. The routine culture grew out Streptococcal pneumoniae. The patient  was started on Bactrim IV and azithromycin IV empirically prior to the  intubation. Serial chest x-rays revealed resolving pneumomediastinum. The  patient was eventually extubated the following day. The followup chest x-ray  revealed resolution of the pneumomediastinum with mild pulmonary vascular  congestion. Prior to the extubation, patient's blood pressure fell into the  80s and  90s. He was volume resuscitated with IV fluids. It was also noted  that he was coagulopathic. His INR increased to 2.6. However, following  extubation, the patient's blood pressures improved and the coagulopathy  corrected. The patient was also given stress doses of steroids when his  blood pressure worsened. The hydrocortisone was eventually tapered off.   The respiratory failure was thought to be secondary to the severe metabolic  acidosis and the spontaneous subcutaneous emphysema and pneumomediastinum.  The patient was started on Rocephin following extubation for further  treatment of the pneumonia. The Bactrim and azithromycin were both  discontinued. After approximately 6 days of treatment with Rocephin, the  Rocephin was discontinued. The patient was continued on prophylactic  treatment with azithromycin 1200 mg once weekly.   Problem 3. PAROXYSMAL SUPRAVENTRICULAR TACHYCARDIA. The patient experienced  a short run of PSVT during the hospital course. He was treated with a short  course of intravenous metoprolol. Following the metoprolol, the patient's  heart rate returned to normal sinus rhythm.   Problem 4. NORMOCYTIC  ANEMIA. The patient's hemoglobin on admission was  11.9. The hemoglobin fell to a low of 8.8 during the hospital course. There  were no overt signs of GI bleeding. The anemia was felt to be secondary to  acute and chronic disease. Prior to hospital discharge, his hemoglobin  stabilized at 9.1 and the hematocrit stabilized at 27.0.   Problem 5. 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  As stated above, the patient was recently diagnosed with HIV. On admission,  he was restarted on Sustiva and Truvada per the recommendations of  infectious disease physicians. However, given the patient's copious  diarrhea, these medications were discontinued. The patient was subsequently  started on Combivir and Kaletra which were thought to be less diarrhea-  causing. As stated above, the patient was treated empirically with  intravenous Bactrim and azithromycin. The Bactrim was discontinued and the  intravenous azithromycin was changed to 1200 mg p.o. each week. The intravenous Bactrim was discontinued during the hospital course and  apparently was not restarted as an oral medication. Outpatient therapy with  Bactrim will be discussed with infectious diseases physician, Dr. Roxan Hockey,  prior to the patient's hospital discharge.   Problem 6. LABIAL HERPETIC LESION. During the hospital course, the patient  developed a herpetic-like lesion above his upper lip. He was started on  Valtrex for treatment. The herpetic lesion has resolved. The Valtrex will be  discontinued after 9 days of therapy.      Deni   DF/MEDQ  D:  07/26/2004  T:  07/26/2004  Job:  [id number]   cc:   Rockey Situ. Flavia Shipper., M.D.  [address]  [place]  [place] [address]  Fax: [phone]

## 2010-12-04 NOTE — H&P (Signed)
NAME:  Tommy Galvan, Tommy Galvan NO.:  [id number]   MEDICAL RECORD NO.:  [id number]          PATIENT TYPE:  INP   LOCATION:  1844                         FACILITY:  [facility]   PHYSICIAN:  Toby L. Catalina Pizza, M.D.   DATE OF BIRTH:  1970-07-23   DATE OF ADMISSION:  07/13/2004  DATE OF DISCHARGE:                                HISTORY & PHYSICAL   PRIMARY CARE PHYSICIAN:  Maryelizabeth Rowan, MD.   CHIEF COMPLAINT:  Diarrhea.   HISTORY OF PRESENT ILLNESS:  Mr. Rowlette is a 40 year old, Caucasian male, who  was diagnosed with HIV/AIDS about one month ago.  He presented due to  diarrhea at that time.  He was found to have a CD-4 count of less than 10,  and a viral load of approximately 300,000.  The patient was started on  Sustiva and Travada.  He was ultimately diagnosed with cryptosporidiosis.  Initially, he was started on Flagyl and Cipro for approximately seven days.  Symptoms did not resolve and patient was subsequently started on Alinia.  He  has been on Alinia for approximately three days now.  There has been very  little improvement in the diarrhea.  He has now developed nausea and  vomiting.  In the emergency department, the patient was found to be acidotic  and hypokalemic.  His pH was found to be 7.184, bicarb was 11.9, and CO2  31.7.  His potassium was 2.6.  In the ED, the patient received 30 mEq of  potassium chloride IV.  In addition, he received Zofran for nausea and  vomiting.  The patient continues to complain of nausea.  He has not vomited  since receiving the Zofran.   PAST MEDICAL HISTORY:  1.  HIV/AIDS.  2.  Cryptosporidiosis.   PAST SURGICAL HISTORY:  Appendectomy.   ALLERGIES:  PENICILLIN.   MEDICINES:  1.  Alinia 500 mg one p.o. b.i.d.  2.  Travada one p.o. daily.  3.  Sustiva 600 mg one p.o. daily.  4.  Bactrim Double Strength one p.o. daily.  5.  Possibly azithromycin.   SOCIAL HISTORY:  The patient denies tobacco, alcohol, and IV drug abuse.  He  is  bisexual.  He works as a Corporate investment banker.  No recent travel.   FAMILY HISTORY:  Both parents are alive and healthy.   REVIEW OF SYSTEMS:  As per HPI.  Otherwise, negative.   PHYSICAL EXAMINATION:  VITAL SIGNS:  Temperature 97.3, blood pressure at  113/67, pulse rate 70, respiratory rate 18.  HEENT:  Pupils were equally round and reactive to light,  extraocular  muscles were intact, and there was no scleral icterus.  Oropharynx was  clear, but dry.  There was no erythema or thrush. Tympanic membranes were  clear bilaterally.  NECK:  No JVD, no adenopathy, no carotid bruit.  HEART:  Regular rate and rhythm.  No murmurs, rubs, or gallops.  LUNGS:  Clear to auscultation bilaterally.  No wheezes, rales, or rhonchi.  ABDOMEN:  Positive bowel sounds, nontender, nondistended.  EXTREMITIES:  No edema.   LABORATORY DATA:  Sodium  147, potassium 2.6, chloride 118, BUN 25, glucose  145, pH 7.184, pCO2 31.7, bicarb 11.9, hematocrit 49%, hemoglobin 16.7,  creatinine 1.8.  UA was significant for small hemoglobin, small bilirubin,  15 ketones, and 100 protein.  White blood cell count 14.9, hemoglobin 16.8,  hematocrit 48.1, platelets 476.   ASSESSMENT/PLAN:  1.  Cryptosporidiosis:  I spoke with Dr. Maurice March, Infectious Disease      specialist.  He suggested that we continue Alinia and also continue      HAART therapy for now.  2.  HIV/AIDS:  We will continue Travada and Sustiva for now.  If there is      concern that the patient has lactic acidosis, we will discontinue HAART      therapy.  The patient will be continued on Bactrim Double Strength PCP      prophylaxis.  I am not clear as to whether the patient was on      azithromycin.  AFB blood cultures may need to be drawn.  I will leave      this up to the Infectious Disease consultants.  3.  Acidosis:  This is likely non-anion gap metabolic acidosis due to      diarrhea.  There is always the concern, with the patient being on      Travada, that  he could have lactic acidosis.  I will check a lactic acid      level.  If this level is greatly elevated, then I will discontinue HAART      therapy.  The patient will receive one amp of bicarb.  I will recheck      ABG following the bicarb and dose bicarb appropriately.  4.  Hypokalemia:  The patient is currently receiving 30 mEq of intravenous      potassium in the emergency department.  I will recheck a potassium      following this and will redose the potassium appropriately.  5.  This patient is a full code.       TLF/MEDQ  D:  07/13/2004  T:  07/14/2004  Job:  [id number]

## 2010-12-04 NOTE — Consult Note (Signed)
NAMETREVEON, BOURCIER NO.:  [id number]   MEDICAL RECORD NO.:  [id number]          PATIENT TYPE:  INP   LOCATION:  2111                         FACILITY:  [facility]   PHYSICIAN:  Iva Boop, M.D. LHCDATE OF BIRTH:  April 23, 1971   DATE OF CONSULTATION:  07/17/2004  DATE OF DISCHARGE:                                   CONSULTATION   REQUESTING PHYSICIAN:  Critical Care Service.   REASON FOR CONSULTATION:  Severe diarrhea.   HISTORY:  This is a 40 year old white man that was recently diagnosed with  AIDS and had started Sustiva and Truvada.  He has been having diarrhea for  about a month, had an outpatient colonoscopy with Griffith Citron, M.D.,  and the diagnosis of cryptosporidiosis was made based upon that.  He was  placed on Cipro and Flagyl which helped a little bit but did not resolve  diarrhea.  Then he was started on milennia.  He had nausea and vomiting,  weight loss of about 30 pounds by the time he was admitted on July 13, 2004, and he was acidemic and hypokalemic.  After admission, he was given  massive doses of potassium and electrolytes, had respiratory acidosis, and  required intubation on July 15, 2004, and after vigorous support, he is  off the ventilator.  Still with about 1300 of watery diarrhea out so far  body.  Noni Saupe was stopped as it was thought that it might be exacerbating  his diarrhea.  He is on Bactrim and Zithromax currently.   OTHER MEDICATIONS:  Zithromax, Protonix, Novolog, SS Bactrim, Zofran.   DRUG ALLERGIES:  PENICILLIN.   PAST HISTORY:  Appendectomy and as above.   SOCIAL HISTORY:  He is a Holiday representative work, no tobacco, no alcohol.   REVIEW OF SYSTEMS:  Weight loss, fatigue, and nausea as mentioned above.   PHYSICAL EXAMINATION:  GENERAL:  Thin white male in no acute distress though  he is clearly ill.  Input 1630, output [id number].  VITAL SIGNS:  Temperature 98, blood pressure 120/55, pulse 80.  ENT:  He has some  sores on his lips.  LUNGS:  Clear, S1 and S2, no murmurs or gallops.  ABDOMEN:  Soft, nontender, bowel sounds increased, no organomegaly or mass.  RECTAL:  Greenish liquid stool.  EXTREMITIES:  No edema.   ASSESSMENT:  A 40 year old male with severe diarrhea, diagnosis of  cryptosporidiosis.  Other stool studies so far have been unremarkable  including Clostridium difficile.  An acid-fast smear of the stool was  negative as well.  Occult stool blood has been negative.  He is improved at  this point due to the aggressive support.  I must assume that this is still  cryptosporidiosis and nothing else though.  He could have another type of  infection.   The most important thing to treat his cryptosporidiosis is to restore his  immune system with HAART therapy.  Will defer to ID on that.  In the  meantime, would continued supportive care.  We will add some Imodium, and I  think oral rehydration  with something like Pedialyte would be appropriate.  We will go ahead and check a stool O&P and culture and sensitivity one more  time.  If Imodium or Lomotil would not work, then we can try sandostatin  perhaps for symptomatic relief of the diarrhea.   Not mentioned above were his laboratory values:  White count 3.2, hemoglobin  8.8, platelets 244,000.  INR is 2.  Sodium 147, potassium 3.5 today, BUN 29,  creatinine 1.3.  His chloride is 116.  Calcium 8.1.  Bicarb 28.  Clostridium  difficile negative x2.  Prealbumin is 12.  Albumin is 2.       CEG/MEDQ  D:  07/17/2004  T:  07/17/2004  Job:  [id number]   cc:   Oley Balm. Sung Amabile, M.D. [facility]

## 2010-12-04 NOTE — Op Note (Signed)
NAME:  Tommy Galvan, Tommy Galvan NO.:  [id number]   MEDICAL RECORD NO.:  [id number]          PATIENT TYPE:  INP   LOCATION:  5731                         FACILITY:  [facility]   PHYSICIAN:  Jefry H. Pollyann Kennedy, MD     DATE OF BIRTH:  Oct 25, 1970   DATE OF PROCEDURE:  09/17/2004  DATE OF DISCHARGE:                                 OPERATIVE REPORT   PREOPERATIVE DIAGNOSIS:  Right neck abscess.   POSTOPERATIVE DIAGNOSIS:  Right neck abscess.   PROCEDURE:  Incision and drainage, right neck abscess.   SURGEON:  Jefry H. Pollyann Kennedy, MD.   General endotracheal anesthesia was used.  No complications.   FINDINGS:  A large abscess cavity with a thick, greenish purulent material.  The surrounding tissue was inflamed and granulomatous.  Blood loss minimal.  Cultures were sent for Gram stain, aerobic and anaerobic culture, fungal and  AFB smear and culture and Pneumocystis pneumonia smear as well, as well as  histologic evaluation of the granulation tissue.   REFERRING PHYSICIAN:  Dr. Burnice Logan.   HISTORY:  This is a 33-year gentleman with a history of HIV disease who  developed sudden onset of swelling and fluctuance of the right neck for the  past several days as he has become more immunocompetent.  He had previously  undergone fine needle aspiration biopsy by Dr. Lazarus Salines of this site within  the past couple of weeks.  Pathology was reportedly benign.  Risks,  benefits, alternatives and complications of procedure were explained to the  patient, who seemed to understand and agreed to surgery.   PROCEDURE:  The patient was taken and placed on the operating table in  supine position.  Following induction of general endotracheal anesthesia,  the neck was prepped and draped in standard fashion.  A transverse incision  was outlined about cm below the angle of mandible.  A 15 scalpel was used to  incise the skin and subcutaneous tissue.  Electrocautery dissection was used  to go through the  platysmal layer.  A hemostat was gently passed into the  abscess cavity, and a large amount of purulent material was obtained.  Appropriate cultures and sensitivity testing were sent.  The opening was  enlarged using the electrocautery and digital palpation was used to evacuate  all of the abscess cavity and to open up the entire cavity and break down  loculations.  Multiple fragments of granulation tissue was  sent for pathologic evaluation.  The wound was irrigated copiously with  saline solution.  Two half-inch Penrose drains were placed into the depths  of the abscess cavity, secured in place with a nylon suture and a safety  pin.  A sterile dressing was applied.  The patient was awakened, extubated,  transferred to recovery in stable position.      JHR/MEDQ  D:  09/16/2004  T:  09/17/2004  Job:  [id number]   cc:   Rockey Situ. Flavia Shipper., M.D.  [address]  [place]  [place] [address]  Fax: [phone]

## 2011-04-09 LAB — T-HELPER CELL (CD4) - (RCID CLINIC ONLY)
CD4 % Helper T Cell: 9 — ABNORMAL LOW
CD4 T Cell Abs: 190 — ABNORMAL LOW

## 2011-04-28 LAB — T-HELPER CELL (CD4) - (RCID CLINIC ONLY)
CD4 % Helper T Cell: 9 — ABNORMAL LOW
CD4 T Cell Abs: 230 — ABNORMAL LOW

## 2011-06-08 ENCOUNTER — Other Ambulatory Visit: Payer: Self-pay | Admitting: Infectious Disease

## 2011-06-08 DIAGNOSIS — B20 Human immunodeficiency virus [HIV] disease: Secondary | ICD-10-CM

## 2011-06-08 NOTE — Telephone Encounter (Signed)
Has upcoming appt in december 

## 2011-07-05 ENCOUNTER — Other Ambulatory Visit: Payer: Self-pay | Admitting: Infectious Disease

## 2011-07-05 DIAGNOSIS — B20 Human immunodeficiency virus [HIV] disease: Secondary | ICD-10-CM

## 2011-07-05 DIAGNOSIS — Z113 Encounter for screening for infections with a predominantly sexual mode of transmission: Secondary | ICD-10-CM

## 2011-07-05 DIAGNOSIS — Z79899 Other long term (current) drug therapy: Secondary | ICD-10-CM

## 2011-07-06 ENCOUNTER — Other Ambulatory Visit: Payer: Self-pay

## 2011-07-07 ENCOUNTER — Telehealth: Payer: Self-pay

## 2011-07-07 NOTE — Telephone Encounter (Signed)
Called patient to see if he needed Tommy Galvan financial assistance and he stated he has [employer] and it covers most everything - advised to show ins card to front desk when he came in to make sure it's in Epic system.

## 2011-07-22 ENCOUNTER — Ambulatory Visit (INDEPENDENT_AMBULATORY_CARE_PROVIDER_SITE_OTHER): Payer: Self-pay | Admitting: Infectious Disease

## 2011-07-22 ENCOUNTER — Encounter: Payer: Self-pay | Admitting: Infectious Disease

## 2011-07-22 DIAGNOSIS — K5289 Other specified noninfective gastroenteritis and colitis: Secondary | ICD-10-CM

## 2011-07-22 DIAGNOSIS — B20 Human immunodeficiency virus [HIV] disease: Secondary | ICD-10-CM

## 2011-07-22 DIAGNOSIS — K529 Noninfective gastroenteritis and colitis, unspecified: Secondary | ICD-10-CM | POA: Insufficient documentation

## 2011-07-22 MED ORDER — ABACAVIR SULFATE-LAMIVUDINE 600-300 MG PO TABS: 1.0000 | ORAL_TABLET | Freq: Every day | ORAL | Status: DC

## 2011-07-22 MED ORDER — DARUNAVIR ETHANOLATE 800 MG PO TABS: 800.0000 mg | ORAL_TABLET | Freq: Every day | ORAL | Status: DC

## 2011-07-22 MED ORDER — RITONAVIR 100 MG PO TABS: 100.0000 mg | ORAL_TABLET | Freq: Every day | ORAL | Status: DC

## 2011-07-22 NOTE — Assessment & Plan Note (Signed)
Check routine labs. This seems to be resolving

## 2011-07-22 NOTE — Assessment & Plan Note (Signed)
Will change to prezista, norvir and epzicom 

## 2011-07-22 NOTE — Progress Notes (Signed)
  Subjective:    Patient ID: Tommy Galvan, male    DOB: [birth year], 41 y.o.   MRN: [id number]  HPI  Tommy Galvan is a 41 y.o. male who is doing superbly well on their antiviral regimen, with undetectable viral load and health cd4 count. He did succumb to gastroenteritis this weekend with nausea, vomtiing and diarreha that is now improving. I did propose change in his regimen to more modern prezista 800 norvir 100 with epzicom and he agreed to this. I spent greater than 45 minutes with the patient including greater than 50% of time in face to face counsel of the patient and in coordination of their care.    Review of Systems  Constitutional: Negative for fever, chills, diaphoresis, activity change, appetite change, fatigue and unexpected weight change.  HENT: Negative for congestion, sore throat, rhinorrhea, sneezing, trouble swallowing and sinus pressure.   Eyes: Negative for photophobia and visual disturbance.  Respiratory: Negative for cough, chest tightness, shortness of breath, wheezing and stridor.   Cardiovascular: Negative for chest pain, palpitations and leg swelling.  Gastrointestinal: Positive for nausea, vomiting, abdominal pain and diarrhea. Negative for constipation, blood in stool, abdominal distention and anal bleeding.  Genitourinary: Negative for dysuria, hematuria, flank pain and difficulty urinating.  Musculoskeletal: Negative for myalgias, back pain, joint swelling, arthralgias and gait problem.  Skin: Negative for color change, pallor, rash and wound.  Neurological: Negative for dizziness, tremors, weakness and light-headedness.  Hematological: Negative for adenopathy. Does not bruise/bleed easily.  Psychiatric/Behavioral: Negative for behavioral problems, confusion, sleep disturbance, dysphoric mood, decreased concentration and agitation.       Objective:   Physical Exam  Constitutional: He is oriented to person, place, and time. He appears well-developed and well-nourished.  No distress.  HENT:  Head: Normocephalic and atraumatic.  Mouth/Throat: Oropharynx is clear and moist. No oropharyngeal exudate.  Eyes: Conjunctivae and EOM are normal. Pupils are equal, round, and reactive to light. No scleral icterus.  Neck: Normal range of motion. Neck supple. No JVD present.  Cardiovascular: Normal rate, regular rhythm and normal heart sounds.  Exam reveals no gallop and no friction rub.   No murmur heard. Pulmonary/Chest: Effort normal and breath sounds normal. No respiratory distress. He has no wheezes. He has no rales. He exhibits no tenderness.  Abdominal: He exhibits no distension and no mass. There is tenderness in the epigastric area. There is no rebound and no guarding.    Musculoskeletal: He exhibits no edema and no tenderness.  Lymphadenopathy:    He has no cervical adenopathy.  Neurological: He is alert and oriented to person, place, and time. He has normal reflexes. He exhibits normal muscle tone. Coordination normal.  Skin: Skin is warm and dry. He is not diaphoretic. No erythema. No pallor.  Psychiatric: He has a normal mood and affect. His behavior is normal. Judgment and thought content normal.          Assessment & Plan:

## 2011-07-23 LAB — CBC WITH DIFFERENTIAL/PLATELET
Basophils Relative: 1 % (ref 0–1)
Eosinophils Absolute: 0.1 10*3/uL (ref 0.0–0.7)
Eosinophils Relative: 3 % (ref 0–5)
HCT: 42.6 % (ref 39.0–52.0)
MCHC: 34.3 g/dL (ref 30.0–36.0)
Monocytes Absolute: 0.6 10*3/uL (ref 0.1–1.0)
Monocytes Relative: 12 % (ref 3–12)
Neutrophils Relative %: 45 % (ref 43–77)
RBC: 4.28 MIL/uL (ref 4.22–5.81)
WBC: 5.1 10*3/uL (ref 4.0–10.5)

## 2011-07-23 LAB — LIPID PANEL
Cholesterol: 135 mg/dL (ref 0–200)
HDL: 47 mg/dL
LDL Cholesterol: 70 mg/dL (ref 0–99)
Total CHOL/HDL Ratio: 2.9 ratio
Triglycerides: 91 mg/dL
VLDL: 18 mg/dL (ref 0–40)

## 2011-07-23 LAB — COMPLETE METABOLIC PANEL WITHOUT GFR
ALT: 39 U/L (ref 0–53)
AST: 31 U/L (ref 0–37)
Albumin: 4.5 g/dL (ref 3.5–5.2)
Alkaline Phosphatase: 64 U/L (ref 39–117)
BUN: 11 mg/dL (ref 6–23)
CO2: 30 meq/L (ref 19–32)
Calcium: 9.5 mg/dL (ref 8.4–10.5)
Chloride: 102 meq/L (ref 96–112)
Creat: 0.85 mg/dL (ref 0.50–1.35)
GFR, Est African American: >89 mL/min
GFR, Est Non African American: >89 mL/min
Glucose, Bld: 73 mg/dL (ref 70–99)
Potassium: 4.3 meq/L (ref 3.5–5.3)
Sodium: 142 meq/L (ref 135–145)
Total Bilirubin: 0.5 mg/dL (ref 0.3–1.2)
Total Protein: 6.5 g/dL (ref 6.0–8.3)

## 2011-07-23 LAB — GC/CHLAMYDIA PROBE AMP, URINE
Chlamydia, Swab/Urine, PCR: NEGATIVE
GC Probe Amp, Urine: NEGATIVE

## 2011-07-23 LAB — T-HELPER CELL (CD4) - (RCID CLINIC ONLY)
CD4 % Helper T Cell: 22 % — ABNORMAL LOW (ref 33–55)
CD4 T Cell Abs: 430 uL (ref 400–2700)

## 2011-07-26 LAB — HIV-1 RNA QUANT-NO REFLEX-BLD: HIV-1 RNA Quant, Log: 1.87 {Log} — ABNORMAL HIGH (ref <1.30)

## 2011-08-02 ENCOUNTER — Telehealth: Payer: Self-pay | Admitting: *Deleted

## 2011-08-02 NOTE — Telephone Encounter (Signed)
Printed results.  Pt will pick them up today.

## 2011-08-25 ENCOUNTER — Telehealth: Payer: Self-pay | Admitting: *Deleted

## 2011-08-25 ENCOUNTER — Encounter: Payer: Self-pay | Admitting: Internal Medicine

## 2011-08-25 ENCOUNTER — Ambulatory Visit (INDEPENDENT_AMBULATORY_CARE_PROVIDER_SITE_OTHER): Payer: BC Managed Care – PPO | Admitting: Internal Medicine

## 2011-08-25 DIAGNOSIS — R41840 Attention and concentration deficit: Secondary | ICD-10-CM

## 2011-08-25 NOTE — Telephone Encounter (Signed)
States he forgot to mention his depression at his last appt in January. States it is not getting any better & he wants to talk with md about it. Transferred to front desk to make an appt. He will see Dr. Luciana Axe today at 3:45. His md has no openings until later in the month. He is willing to see another md

## 2011-09-06 ENCOUNTER — Encounter: Payer: Self-pay | Admitting: Internal Medicine

## 2011-09-06 DIAGNOSIS — R41840 Attention and concentration deficit: Secondary | ICD-10-CM | POA: Insufficient documentation

## 2011-09-06 NOTE — Assessment & Plan Note (Signed)
I advised him to never take the prescription of someone else and to stop the Adderall.  I will refer him to a mental health provider for further evaluation but explained that he needs a formal evaluation for ADHD and should not just start the medication otherwise.  He will be referred.

## 2011-09-06 NOTE — Progress Notes (Signed)
  Subjective:    Patient ID: Tommy Galvan, male    DOB: Dec 10, 1970, 41 y.o.   MRN: [id number]  HPI He comes in today for an unscheduled visit to discuss recent problems with concentration.  He tells me that he has been having trouble concentrating at work and recently found out that his brother has adult onset ADHD.  He tried one of his brothers Adderal pills (one time) and now feels his concentration has improved.  He though admits that he has not had any difficulty at work that has put his job in jeopardy or difficulty completing tasks.     Review of Systems  Cardiovascular: Negative for chest pain.  Gastrointestinal: Negative for nausea, abdominal pain and diarrhea.  Musculoskeletal: Negative for myalgias and arthralgias.  Neurological: Negative for syncope, light-headedness and headaches.  Psychiatric/Behavioral: Positive for decreased concentration. Negative for behavioral problems, sleep disturbance and dysphoric mood. The patient is not hyperactive.        Objective:   Physical Exam  Constitutional: He is oriented to person, place, and time. He appears well-developed and well-nourished. No distress.  HENT:  Mouth/Throat: No oropharyngeal exudate.  Cardiovascular: Normal rate, regular rhythm and normal heart sounds.  Exam reveals no gallop and no friction rub.   No murmur heard. Pulmonary/Chest: Effort normal and breath sounds normal. No respiratory distress. He has no wheezes. He has no rales.  Abdominal: Soft. Bowel sounds are normal. He exhibits no distension. There is no tenderness.  Neurological: He is alert and oriented to person, place, and time.  Psychiatric:       Pleasant, mildly pressured speech          Assessment & Plan:

## 2012-06-06 ENCOUNTER — Ambulatory Visit (INDEPENDENT_AMBULATORY_CARE_PROVIDER_SITE_OTHER): Payer: BC Managed Care – PPO

## 2012-06-06 DIAGNOSIS — Z23 Encounter for immunization: Secondary | ICD-10-CM

## 2012-07-05 ENCOUNTER — Other Ambulatory Visit: Payer: BC Managed Care – PPO

## 2012-07-21 ENCOUNTER — Other Ambulatory Visit: Payer: Self-pay | Admitting: *Deleted

## 2012-07-21 DIAGNOSIS — B20 Human immunodeficiency virus [HIV] disease: Secondary | ICD-10-CM

## 2012-07-21 DIAGNOSIS — K529 Noninfective gastroenteritis and colitis, unspecified: Secondary | ICD-10-CM

## 2012-07-21 MED ORDER — RITONAVIR 100 MG PO TABS: 100.0000 mg | ORAL_TABLET | Freq: Every day | ORAL | Status: DC

## 2012-07-21 MED ORDER — DARUNAVIR ETHANOLATE 800 MG PO TABS: 800.0000 mg | ORAL_TABLET | Freq: Every day | ORAL | Status: DC

## 2012-07-21 MED ORDER — ABACAVIR SULFATE-LAMIVUDINE 600-300 MG PO TABS: 1.0000 | ORAL_TABLET | Freq: Every day | ORAL | Status: DC

## 2012-07-24 ENCOUNTER — Other Ambulatory Visit: Payer: Self-pay | Admitting: *Deleted

## 2012-07-24 DIAGNOSIS — B20 Human immunodeficiency virus [HIV] disease: Secondary | ICD-10-CM

## 2012-07-24 DIAGNOSIS — K529 Noninfective gastroenteritis and colitis, unspecified: Secondary | ICD-10-CM

## 2012-07-25 ENCOUNTER — Other Ambulatory Visit: Payer: BC Managed Care – PPO

## 2012-07-26 ENCOUNTER — Ambulatory Visit: Payer: BC Managed Care – PPO | Admitting: Infectious Disease

## 2012-08-08 ENCOUNTER — Ambulatory Visit: Payer: BC Managed Care – PPO | Admitting: Infectious Disease

## 2012-08-14 ENCOUNTER — Other Ambulatory Visit: Payer: Self-pay | Admitting: Infectious Disease

## 2012-08-14 ENCOUNTER — Other Ambulatory Visit: Payer: BC Managed Care – PPO

## 2012-08-14 DIAGNOSIS — B20 Human immunodeficiency virus [HIV] disease: Secondary | ICD-10-CM

## 2012-08-14 DIAGNOSIS — K529 Noninfective gastroenteritis and colitis, unspecified: Secondary | ICD-10-CM

## 2012-08-14 LAB — CBC WITH DIFFERENTIAL/PLATELET
Basophils Relative: 1 % (ref 0–1)
Eosinophils Absolute: 0.2 10*3/uL (ref 0.0–0.7)
Eosinophils Relative: 4 % (ref 0–5)
MCH: 33.5 pg (ref 26.0–34.0)
MCHC: 35.5 g/dL (ref 30.0–36.0)
MCV: 94.2 fL (ref 78.0–100.0)
Neutrophils Relative %: 50 % (ref 43–77)
Platelets: 290 10*3/uL (ref 150–400)

## 2012-08-14 LAB — LIPID PANEL
HDL: 61 mg/dL (ref >39)
Total CHOL/HDL Ratio: 3.5 Ratio
Triglycerides: 171 mg/dL — ABNORMAL HIGH (ref <150)

## 2012-08-14 NOTE — Addendum Note (Signed)
Addended by: Mariea Clonts D on: 08/14/2012 05:33 PM   Modules accepted: Orders

## 2012-08-15 LAB — COMPLETE METABOLIC PANEL WITH GFR
ALT: 41 U/L (ref 0–53)
Alkaline Phosphatase: 70 U/L (ref 39–117)
CO2: 29 mEq/L (ref 19–32)
Creat: 0.89 mg/dL (ref 0.50–1.35)
GFR, Est African American: >89 mL/min
GFR, Est Non African American: >89 mL/min
Glucose, Bld: 82 mg/dL (ref 70–99)
Sodium: 139 mEq/L (ref 135–145)
Total Bilirubin: 0.4 mg/dL (ref 0.3–1.2)
Total Protein: 7.1 g/dL (ref 6.0–8.3)

## 2012-08-15 LAB — RPR

## 2012-08-16 LAB — HIV-1 RNA QUANT-NO REFLEX-BLD: HIV 1 RNA Quant: <20 copies/mL (ref <20)

## 2012-08-30 ENCOUNTER — Ambulatory Visit: Payer: BC Managed Care – PPO | Admitting: Infectious Disease

## 2012-09-06 ENCOUNTER — Encounter: Payer: Self-pay | Admitting: Infectious Disease

## 2012-09-06 ENCOUNTER — Ambulatory Visit (INDEPENDENT_AMBULATORY_CARE_PROVIDER_SITE_OTHER): Payer: BC Managed Care – PPO | Admitting: Infectious Disease

## 2012-09-06 VITALS — BP 119/80 | HR 76 | Temp 98.2°F | Wt 212.0 lb

## 2012-09-06 DIAGNOSIS — F329 Major depressive disorder, single episode, unspecified: Secondary | ICD-10-CM

## 2012-09-06 DIAGNOSIS — K5289 Other specified noninfective gastroenteritis and colitis: Secondary | ICD-10-CM

## 2012-09-06 DIAGNOSIS — K529 Noninfective gastroenteritis and colitis, unspecified: Secondary | ICD-10-CM

## 2012-09-06 DIAGNOSIS — B20 Human immunodeficiency virus [HIV] disease: Secondary | ICD-10-CM

## 2012-09-06 DIAGNOSIS — R41840 Attention and concentration deficit: Secondary | ICD-10-CM

## 2012-09-06 DIAGNOSIS — R5381 Other malaise: Secondary | ICD-10-CM

## 2012-09-06 MED ORDER — LISDEXAMFETAMINE DIMESYLATE 30 MG PO CAPS: 30.0000 mg | ORAL_CAPSULE | ORAL | Status: DC

## 2012-09-06 MED ORDER — ABACAVIR SULFATE-LAMIVUDINE 600-300 MG PO TABS: 1.0000 | ORAL_TABLET | Freq: Every day | ORAL | Status: DC

## 2012-09-06 MED ORDER — DOLUTEGRAVIR SODIUM 50 MG PO TABS: 50.0000 mg | ORAL_TABLET | Freq: Every day | ORAL | Status: DC

## 2012-09-06 NOTE — Progress Notes (Signed)
  Subjective:    Patient ID: Tommy Galvan, male    DOB: Dec 29, 1970, 42 y.o.   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 Time he was tried initially on Sustiva and brought out but was changed to Kaletra and Combivir I believe for better CNS penetration of the second regimen. In any case he responded well to that regimen of spotted well to kaletra epzicom and then Prezista Norvir and epzicom and has had undetectable viral loads for years.  Today he inquired about his chronic fatigue symptoms and whether or not that might be related to his current antiretroviral regimen. Decided change him from his current regimen of Prezista Norvir and epzicom Truvada 2 tivicay and epzicom.  He has been placed on amphetamine for ADHD by his psychiatrist. I spent greater than 45 minutes with the patient including greater than 50% of time in face to face counsel of the patient and in coordination of their care.   Review of Systems  Constitutional: Positive for fatigue. Negative for fever, chills, diaphoresis, activity change, appetite change and unexpected weight change.  HENT: Negative for congestion, sore throat, rhinorrhea, sneezing, trouble swallowing and sinus pressure.   Eyes: Negative for photophobia and visual disturbance.  Respiratory: Negative for cough, chest tightness, shortness of breath, wheezing and stridor.   Cardiovascular: Negative for chest pain, palpitations and leg swelling.  Gastrointestinal: Negative for nausea, vomiting, abdominal pain, diarrhea, constipation, blood in stool, abdominal distention and anal bleeding.  Genitourinary: Negative for dysuria, hematuria, flank pain and difficulty urinating.  Musculoskeletal: Negative for myalgias, back pain, joint swelling, arthralgias and gait problem.  Skin: Negative for color change, pallor, rash and wound.  Neurological: Negative  for dizziness, tremors, weakness and light-headedness.  Hematological: Negative for adenopathy. Does not bruise/bleed easily.  Psychiatric/Behavioral: Negative for behavioral problems, confusion, sleep disturbance, dysphoric mood, decreased concentration and agitation.       Objective:   Physical Exam  Constitutional: He is oriented to person, place, and time. He appears well-developed and well-nourished. No distress.  HENT:  Head: Normocephalic and atraumatic.  Mouth/Throat: Oropharynx is clear and moist. No oropharyngeal exudate.  Eyes: Conjunctivae and EOM are normal. Pupils are equal, round, and reactive to light. No scleral icterus.  Neck: Normal range of motion. Neck supple. No JVD present.  Cardiovascular: Normal rate, regular rhythm and normal heart sounds.  Exam reveals no gallop and no friction rub.   No murmur heard. Pulmonary/Chest: Effort normal and breath sounds normal. No respiratory distress. He has no wheezes. He has no rales. He exhibits no tenderness.  Abdominal: He exhibits no distension and no mass. There is no tenderness. There is no rebound and no guarding.  Musculoskeletal: He exhibits no edema and no tenderness.  Lymphadenopathy:    He has no cervical adenopathy.  Neurological: He is alert and oriented to person, place, and time. He exhibits normal muscle tone. Coordination normal.  Skin: Skin is warm and dry. He is not diaphoretic. No erythema. No pallor.  Psychiatric: He has a normal mood and affect. His behavior is normal. Judgment and thought content normal.          Assessment & Plan:  HIV: change to Tivicay and Epzicom, rheck vl in one month  Fatigue: doubt this is due to ARV but change regimen.  Depression: Currently seeing psychiatrist also on rx Vyvanse  ADHD: on vyvanse

## 2012-09-26 ENCOUNTER — Ambulatory Visit: Payer: BC Managed Care – PPO | Admitting: Infectious Disease

## 2012-10-04 ENCOUNTER — Other Ambulatory Visit: Payer: BC Managed Care – PPO

## 2012-10-06 ENCOUNTER — Other Ambulatory Visit: Payer: Self-pay | Admitting: *Deleted

## 2012-10-06 DIAGNOSIS — B20 Human immunodeficiency virus [HIV] disease: Secondary | ICD-10-CM

## 2012-10-06 MED ORDER — DOLUTEGRAVIR SODIUM 50 MG PO TABS: 50.0000 mg | ORAL_TABLET | Freq: Every day | ORAL | Status: DC

## 2012-10-10 ENCOUNTER — Other Ambulatory Visit: Payer: Self-pay | Admitting: *Deleted

## 2012-10-18 ENCOUNTER — Ambulatory Visit: Payer: BC Managed Care – PPO | Admitting: Infectious Disease

## 2013-02-06 ENCOUNTER — Telehealth: Payer: Self-pay | Admitting: *Deleted

## 2013-02-06 ENCOUNTER — Other Ambulatory Visit: Payer: BC Managed Care – PPO

## 2013-02-06 NOTE — Telephone Encounter (Signed)
Patient called c/o low grade temp, 99-100 x 3 days. He is having no other symptoms except for a mild headache. Advised him to go to urgent care if he worsens as we have no available appts for this week. I reminded him that he was suppose to come in March for labs and April for a follow up. Scheduled him for a lab appt today and asked him to schedule a f/u with Dr. Daiva Eves when he comes for labs. Wendall Mola

## 2013-05-02 ENCOUNTER — Telehealth: Payer: Self-pay | Admitting: *Deleted

## 2013-05-02 NOTE — Telephone Encounter (Signed)
Pt's pharmacy called to relay patient's request for 90 day supply of meds to be delivered rather than a 30 day supply.  Pt missed 2 appointments, needs to be seen as per last office note 08/2012 when his regimen was changed.  RN requested that the pharmacy send 30 days at this time and notify the patient that he needs to make an appointment before we will authorize a 90 day supply. Andree Coss, RN

## 2013-05-02 NOTE — Telephone Encounter (Signed)
Definitely needs to come in and be seen. DOnt want him to miss any meds though either. He could go to [facility] potentially if he is HLA B [id number] negative

## 2013-05-10 ENCOUNTER — Encounter: Payer: Self-pay | Admitting: *Deleted

## 2013-05-24 ENCOUNTER — Other Ambulatory Visit: Payer: Self-pay

## 2013-08-14 ENCOUNTER — Other Ambulatory Visit: Payer: Self-pay | Admitting: *Deleted

## 2013-08-14 DIAGNOSIS — K529 Noninfective gastroenteritis and colitis, unspecified: Secondary | ICD-10-CM

## 2013-08-14 DIAGNOSIS — B20 Human immunodeficiency virus [HIV] disease: Secondary | ICD-10-CM

## 2013-08-14 MED ORDER — ABACAVIR SULFATE-LAMIVUDINE 600-300 MG PO TABS: 1.0000 | ORAL_TABLET | Freq: Every day | ORAL | Status: DC

## 2013-08-14 MED ORDER — DOLUTEGRAVIR SODIUM 50 MG PO TABS
50.0000 mg | ORAL_TABLET | Freq: Every day | ORAL | Status: DC
Start: 2013-08-14 — End: 2013-10-16

## 2013-09-10 ENCOUNTER — Other Ambulatory Visit: Payer: Self-pay | Admitting: Licensed Clinical Social Worker

## 2013-09-10 DIAGNOSIS — K529 Noninfective gastroenteritis and colitis, unspecified: Secondary | ICD-10-CM

## 2013-09-10 DIAGNOSIS — B20 Human immunodeficiency virus [HIV] disease: Secondary | ICD-10-CM

## 2013-09-10 NOTE — Telephone Encounter (Signed)
Unable to refill patient's medication. He has not been seen since 2/14

## 2013-10-03 ENCOUNTER — Other Ambulatory Visit: Payer: BC Managed Care – PPO

## 2013-10-03 DIAGNOSIS — B20 Human immunodeficiency virus [HIV] disease: Secondary | ICD-10-CM

## 2013-10-04 LAB — T-HELPER CELL (CD4) - (RCID CLINIC ONLY)
CD4 T CELL ABS: 450 /uL (ref 400–2700)
CD4 T CELL HELPER: 16 % — AB (ref 33–55)

## 2013-10-05 LAB — HIV-1 RNA QUANT-NO REFLEX-BLD
HIV 1 RNA Quant: <20 copies/mL (ref <20)
HIV-1 RNA Quant, Log: <1.3 {Log} (ref <1.30)

## 2013-10-08 ENCOUNTER — Encounter: Payer: Self-pay | Admitting: Infectious Disease

## 2013-10-16 ENCOUNTER — Encounter: Payer: Self-pay | Admitting: Infectious Disease

## 2013-10-16 ENCOUNTER — Ambulatory Visit (INDEPENDENT_AMBULATORY_CARE_PROVIDER_SITE_OTHER): Payer: BC Managed Care – PPO | Admitting: Infectious Disease

## 2013-10-16 VITALS — BP 116/77 | HR 78 | Temp 98.2°F | Ht 71.0 in | Wt 235.5 lb

## 2013-10-16 DIAGNOSIS — F329 Major depressive disorder, single episode, unspecified: Secondary | ICD-10-CM

## 2013-10-16 DIAGNOSIS — F909 Attention-deficit hyperactivity disorder, unspecified type: Secondary | ICD-10-CM

## 2013-10-16 DIAGNOSIS — F32A Depression, unspecified: Secondary | ICD-10-CM

## 2013-10-16 DIAGNOSIS — B20 Human immunodeficiency virus [HIV] disease: Secondary | ICD-10-CM

## 2013-10-16 DIAGNOSIS — F3289 Other specified depressive episodes: Secondary | ICD-10-CM

## 2013-10-16 MED ORDER — ABACAVIR-DOLUTEGRAVIR-LAMIVUD 600-50-300 MG PO TABS
1.0000 | ORAL_TABLET | Freq: Every day | ORAL | Status: DC
Start: 2013-10-16 — End: 2014-10-22

## 2013-10-16 NOTE — Progress Notes (Signed)
  Subjective:    Patient ID: Tommy Galvan, male    DOB: 03/02/1971, 43 y.o.   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 Time he was tried initially on Sustiva and brought out but was changed to Kaletra and Combivir I believe for better CNS penetration of the second regimen. In any case he responded well to that regimen and responded well to kaletra epzicom and then Prezista Norvir and epzicom and has had undetectable viral loads for years.  We changed him last February to  2 tivicay and epzicom.  He has had repeat VL and CD4 this March and he is still undetectable with VL and CD4 is healthy. He is ready to make switch to [facility].   Review of Systems  Constitutional: Negative for fever, chills, diaphoresis, activity change, appetite change and unexpected weight change.  HENT: Negative for congestion, rhinorrhea, sinus pressure, sneezing, sore throat and trouble swallowing.   Eyes: Negative for photophobia and visual disturbance.  Respiratory: Negative for cough, chest tightness, shortness of breath, wheezing and stridor.   Cardiovascular: Negative for chest pain, palpitations and leg swelling.  Gastrointestinal: Negative for nausea, vomiting, abdominal pain, diarrhea, constipation, blood in stool, abdominal distention and anal bleeding.  Genitourinary: Negative for dysuria, hematuria, flank pain and difficulty urinating.  Musculoskeletal: Negative for arthralgias, back pain, gait problem, joint swelling and myalgias.  Skin: Negative for color change, pallor, rash and wound.  Neurological: Negative for dizziness, tremors, weakness and light-headedness.  Hematological: Negative for adenopathy. Does not bruise/bleed easily.  Psychiatric/Behavioral: Negative for behavioral problems, confusion, sleep disturbance, dysphoric mood, decreased concentration and agitation.        Objective:   Physical Exam  Constitutional: He is oriented to person, place, and time. He appears well-developed and well-nourished. No distress.  HENT:  Head: Normocephalic and atraumatic.  Mouth/Throat: Oropharynx is clear and moist. No oropharyngeal exudate.  Eyes: Conjunctivae and EOM are normal. Pupils are equal, round, and reactive to light. No scleral icterus.  Neck: Normal range of motion. Neck supple. No JVD present.  Cardiovascular: Normal rate, regular rhythm and normal heart sounds.  Exam reveals no gallop and no friction rub.   No murmur heard. Pulmonary/Chest: Effort normal and breath sounds normal. No respiratory distress. He has no wheezes. He has no rales. He exhibits no tenderness.  Abdominal: He exhibits no distension and no mass. There is no tenderness. There is no rebound and no guarding.  Musculoskeletal: He exhibits no edema and no tenderness.  Lymphadenopathy:    He has no cervical adenopathy.  Neurological: He is alert and oriented to person, place, and time. He exhibits normal muscle tone. Coordination normal.  Skin: Skin is warm and dry. He is not diaphoretic. No erythema. No pallor.  Psychiatric: He has a normal mood and affect. His behavior is normal. Judgment and thought content normal.          Assessment & Plan:  HIV: change to [place], rtc in October for repeat labs I spent greater than 25 minutes with the patient including greater than 50% of time in face to face counsel of the patient and in coordination of their care.   Depression: had been seeing psychiatrist also on rx Vyvanse, did not see on his med list  ADHD: on vyvanse

## 2014-05-03 ENCOUNTER — Other Ambulatory Visit: Payer: Self-pay

## 2014-10-17 ENCOUNTER — Other Ambulatory Visit: Payer: BLUE CROSS/BLUE SHIELD

## 2014-10-17 DIAGNOSIS — Z79899 Other long term (current) drug therapy: Secondary | ICD-10-CM

## 2014-10-17 DIAGNOSIS — B20 Human immunodeficiency virus [HIV] disease: Secondary | ICD-10-CM

## 2014-10-17 DIAGNOSIS — Z113 Encounter for screening for infections with a predominantly sexual mode of transmission: Secondary | ICD-10-CM

## 2014-10-17 LAB — CBC WITH DIFFERENTIAL/PLATELET
Basophils Absolute: 0.1 10*3/uL (ref 0.0–0.1)
Basophils Relative: 1 % (ref 0–1)
EOS PCT: 3 % (ref 0–5)
Eosinophils Absolute: 0.2 10*3/uL (ref 0.0–0.7)
HCT: 44.8 % (ref 39.0–52.0)
HEMOGLOBIN: 15.7 g/dL (ref 13.0–17.0)
LYMPHS ABS: 1.9 10*3/uL (ref 0.7–4.0)
LYMPHS PCT: 36 % (ref 12–46)
MCH: 33 pg (ref 26.0–34.0)
MCHC: 35 g/dL (ref 30.0–36.0)
MCV: 94.1 fL (ref 78.0–100.0)
MPV: 10.5 fL (ref 8.6–12.4)
Monocytes Absolute: 0.6 10*3/uL (ref 0.1–1.0)
Monocytes Relative: 11 % (ref 3–12)
NEUTROS ABS: 2.6 10*3/uL (ref 1.7–7.7)
NEUTROS PCT: 49 % (ref 43–77)
Platelets: 242 10*3/uL (ref 150–400)
RBC: 4.76 MIL/uL (ref 4.22–5.81)
RDW: 13.8 % (ref 11.5–15.5)
WBC: 5.4 10*3/uL (ref 4.0–10.5)

## 2014-10-17 LAB — COMPLETE METABOLIC PANEL WITH GFR
ALK PHOS: 65 U/L (ref 39–117)
ALT: 36 U/L (ref 0–53)
AST: 27 U/L (ref 0–37)
Albumin: 4.7 g/dL (ref 3.5–5.2)
BILIRUBIN TOTAL: 0.7 mg/dL (ref 0.2–1.2)
BUN: 23 mg/dL (ref 6–23)
CO2: 28 mEq/L (ref 19–32)
CREATININE: 1.04 mg/dL (ref 0.50–1.35)
Calcium: 9.7 mg/dL (ref 8.4–10.5)
Chloride: 103 mEq/L (ref 96–112)
GFR, EST NON AFRICAN AMERICAN: 88 mL/min
GLUCOSE: 93 mg/dL (ref 70–99)
Potassium: 4.2 mEq/L (ref 3.5–5.3)
Sodium: 138 mEq/L (ref 135–145)
TOTAL PROTEIN: 7.6 g/dL (ref 6.0–8.3)

## 2014-10-17 LAB — LIPID PANEL
CHOL/HDL RATIO: 3.4 ratio
CHOLESTEROL: 124 mg/dL (ref 0–200)
HDL: 37 mg/dL — AB (ref >=40)
LDL Cholesterol: 64 mg/dL (ref 0–99)
TRIGLYCERIDES: 113 mg/dL (ref <150)
VLDL: 23 mg/dL (ref 0–40)

## 2014-10-17 NOTE — Addendum Note (Signed)
Addended by: Dolan Amen D on: 10/17/2014 04:11 PM   Modules accepted: Orders

## 2014-10-18 LAB — HEPATITIS PANEL, ACUTE
HCV Ab: NEGATIVE
HEP A IGM: NONREACTIVE
Hep B C IgM: NONREACTIVE
Hepatitis B Surface Ag: NEGATIVE

## 2014-10-18 LAB — T-HELPER CELL (CD4) - (RCID CLINIC ONLY)
CD4 T CELL HELPER: 21 % — AB (ref 33–55)
CD4 T Cell Abs: 440 /uL (ref 400–2700)

## 2014-10-18 LAB — RPR

## 2014-10-19 LAB — HIV-1 RNA QUANT-NO REFLEX-BLD: HIV-1 RNA Quant, Log: <1.3 {Log} (ref <1.30)

## 2014-10-22 ENCOUNTER — Other Ambulatory Visit: Payer: Self-pay | Admitting: Infectious Disease

## 2014-10-22 DIAGNOSIS — B20 Human immunodeficiency virus [HIV] disease: Secondary | ICD-10-CM

## 2014-11-05 ENCOUNTER — Encounter: Payer: Self-pay | Admitting: Infectious Disease

## 2014-11-05 ENCOUNTER — Ambulatory Visit (INDEPENDENT_AMBULATORY_CARE_PROVIDER_SITE_OTHER): Payer: BLUE CROSS/BLUE SHIELD | Admitting: Infectious Disease

## 2014-11-05 VITALS — BP 116/77 | HR 77 | Temp 98.3°F | Wt 250.0 lb

## 2014-11-05 DIAGNOSIS — B20 Human immunodeficiency virus [HIV] disease: Secondary | ICD-10-CM

## 2014-11-05 DIAGNOSIS — Z23 Encounter for immunization: Secondary | ICD-10-CM

## 2014-11-05 DIAGNOSIS — R41840 Attention and concentration deficit: Secondary | ICD-10-CM | POA: Diagnosis not present

## 2014-11-05 DIAGNOSIS — Z8659 Personal history of other mental and behavioral disorders: Secondary | ICD-10-CM

## 2014-11-05 MED ORDER — ABACAVIR-DOLUTEGRAVIR-LAMIVUD 600-50-300 MG PO TABS: 1.0000 | ORAL_TABLET | Freq: Every day | ORAL | Status: DC

## 2014-11-05 NOTE — Progress Notes (Signed)
  Subjective:    Patient ID: Tommy Galvan, male    DOB: January 21, 1971, 44 y.o.   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 Time he was tried initially on Sustiva and brought out but was changed to Kaletra and Combivir I believe for better CNS penetration of the second regimen. In any case he responded well to that regimen and responded well to kaletra epzicom and then Prezista Norvir and epzicom and has had undetectable viral loads for years.  We changed him last February to  2 tivicay and epzicom. Which we then changed to [facility].'  He has continued perfect virological suppression.  Lab Results  Component Value Date   HIV1RNAQUANT <20 10/17/2014   Lab Results  Component Value Date   CD4TABS 440 10/17/2014   CD4TABS 450 10/03/2013   CD4TABS 440 08/14/2012      Review of Systems  Constitutional: Negative for fever, chills, diaphoresis, activity change, appetite change and unexpected weight change.  HENT: Negative for congestion, rhinorrhea, sinus pressure, sneezing, sore throat and trouble swallowing.   Eyes: Negative for photophobia and visual disturbance.  Respiratory: Negative for cough, chest tightness, shortness of breath, wheezing and stridor.   Cardiovascular: Negative for chest pain, palpitations and leg swelling.  Gastrointestinal: Negative for nausea, vomiting, abdominal pain, diarrhea, constipation, blood in stool, abdominal distention and anal bleeding.  Genitourinary: Negative for dysuria, hematuria, flank pain and difficulty urinating.  Musculoskeletal: Negative for myalgias, back pain, joint swelling, arthralgias and gait problem.  Skin: Negative for color change, pallor, rash and wound.  Neurological: Negative for dizziness, tremors, weakness and light-headedness.  Hematological: Negative for adenopathy. Does not bruise/bleed easily.   Psychiatric/Behavioral: Negative for behavioral problems, confusion, sleep disturbance, dysphoric mood, decreased concentration and agitation.       Objective:   Physical Exam  Constitutional: He is oriented to person, place, and time. He appears well-developed and well-nourished. No distress.  HENT:  Head: Normocephalic and atraumatic.  Mouth/Throat: Oropharynx is clear and moist. No oropharyngeal exudate.  Eyes: Conjunctivae and EOM are normal. Pupils are equal, round, and reactive to light. No scleral icterus.  Neck: Normal range of motion. Neck supple. No JVD present.  Cardiovascular: Normal rate, regular rhythm and normal heart sounds.  Exam reveals no gallop and no friction rub.   No murmur heard. Pulmonary/Chest: Effort normal and breath sounds normal. No respiratory distress. He has no wheezes. He has no rales. He exhibits no tenderness.  Abdominal: He exhibits no distension and no mass. There is no tenderness. There is no rebound and no guarding.  Musculoskeletal: He exhibits no edema or tenderness.  Lymphadenopathy:    He has no cervical adenopathy.  Neurological: He is alert and oriented to person, place, and time. He exhibits normal muscle tone. Coordination normal.  Skin: Skin is warm and dry. He is not diaphoretic. No erythema. No pallor.  Psychiatric: He has a normal mood and affect. His behavior is normal. Judgment and thought content normal.          Assessment & Plan:  HIV: continue TRIUMEQ rtc in 6 months   Depression: had been seeing psychiatrist also on rx Vyvanse  ADHD: on vyvanse

## 2015-02-24 ENCOUNTER — Other Ambulatory Visit: Payer: Self-pay | Admitting: Licensed Clinical Social Worker

## 2015-02-24 DIAGNOSIS — B20 Human immunodeficiency virus [HIV] disease: Secondary | ICD-10-CM

## 2015-02-24 MED ORDER — ABACAVIR-DOLUTEGRAVIR-LAMIVUD 600-50-300 MG PO TABS: 1.0000 | ORAL_TABLET | Freq: Every day | ORAL | Status: DC

## 2015-08-18 ENCOUNTER — Other Ambulatory Visit: Payer: Self-pay | Admitting: *Deleted

## 2015-08-18 DIAGNOSIS — B20 Human immunodeficiency virus [HIV] disease: Secondary | ICD-10-CM

## 2015-08-18 MED ORDER — ABACAVIR-DOLUTEGRAVIR-LAMIVUD 600-50-300 MG PO TABS: 1.0000 | ORAL_TABLET | Freq: Every day | ORAL | Status: DC

## 2015-09-23 ENCOUNTER — Other Ambulatory Visit: Payer: BLUE CROSS/BLUE SHIELD

## 2015-10-06 ENCOUNTER — Ambulatory Visit (INDEPENDENT_AMBULATORY_CARE_PROVIDER_SITE_OTHER): Payer: 59 | Admitting: Infectious Disease

## 2015-10-06 ENCOUNTER — Encounter: Payer: Self-pay | Admitting: Infectious Disease

## 2015-10-06 VITALS — BP 128/82 | HR 68 | Temp 97.9°F | Ht 71.0 in | Wt 214.0 lb

## 2015-10-06 DIAGNOSIS — G9349 Other encephalopathy: Secondary | ICD-10-CM

## 2015-10-06 DIAGNOSIS — B20 Human immunodeficiency virus [HIV] disease: Secondary | ICD-10-CM | POA: Diagnosis not present

## 2015-10-06 DIAGNOSIS — Z23 Encounter for immunization: Secondary | ICD-10-CM

## 2015-10-06 DIAGNOSIS — G9340 Encephalopathy, unspecified: Secondary | ICD-10-CM

## 2015-10-06 DIAGNOSIS — Z8659 Personal history of other mental and behavioral disorders: Secondary | ICD-10-CM

## 2015-10-06 DIAGNOSIS — B009 Herpesviral infection, unspecified: Secondary | ICD-10-CM

## 2015-10-06 HISTORY — DX: Human immunodeficiency virus (HIV) disease: B20

## 2015-10-06 NOTE — Progress Notes (Signed)
Subjective:   Chief complaint: followup for HIV on medications   Patient ID: Tommy Galvan, male    DOB: January 14, 1971, 45 y.o.   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 Time he was tried initially on Sustiva and brought out but was changed to Kaletra and Combivir I believe for better CNS penetration of the second regimen. In any case he responded well to that regimen and responded well to kaletra epzicom and then Prezista Norvir and epzicom and has had undetectable viral loads for years.  We changed him last February to  2 tivicay and epzico and  then changed to [facility]  He has continued perfect virological suppression.  Lab Results  Component Value Date   HIV1RNAQUANT <20 10/17/2014   HIV1RNAQUANT <20 10/03/2013   HIV1RNAQUANT <20 08/14/2012     Lab Results  Component Value Date   CD4TABS 440 10/17/2014   CD4TABS 450 10/03/2013   CD4TABS 440 08/14/2012   Past Medical History  Diagnosis Date  . HIV infection ([place])   . Depression   . HIV disease ([place]) 10/06/2015  . 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  No family history on file.    Social History   Social History  . Marital Status: Single    Spouse Name: N/A  . Number of Children: N/A  . Years of Education: N/A   Social History Main Topics  . Smoking status: Never Smoker   . Smokeless tobacco: Never Used  . Alcohol Use: No  . Drug Use: No  . Sexual Activity:    Partners: Male     Comment: pt. declined condoms   Other Topics Concern  . None   Social History Narrative    Allergies  Allergen Reactions  . Penicillins     REACTION: Reaction unknown; remote     Current outpatient prescriptions:  .  abacavir-dolutegravir-lamiVUDine (TRIUMEQ) 600-50-300 MG tablet, Take 1 tablet by mouth daily., Disp: 90 tablet, Rfl: 3    Review of Systems   Constitutional: Negative for fever, chills, diaphoresis, activity change, appetite change and unexpected weight change.  HENT: Negative for congestion, rhinorrhea, sinus pressure, sneezing, sore throat and trouble swallowing.   Eyes: Negative for photophobia and visual disturbance.  Respiratory: Negative for cough, chest tightness, shortness of breath, wheezing and stridor.   Cardiovascular: Negative for chest pain, palpitations and leg swelling.  Gastrointestinal: Negative for nausea, vomiting, abdominal pain, diarrhea, constipation, blood in stool, abdominal distention and anal bleeding.  Genitourinary: Negative for dysuria, hematuria, flank pain and difficulty urinating.  Musculoskeletal: Negative for myalgias, back pain, joint swelling, arthralgias and gait problem.  Skin: Negative for color change, pallor, rash and wound.  Neurological: Negative for dizziness, tremors, weakness and light-headedness.  Hematological: Negative for adenopathy. Does not bruise/bleed easily.  Psychiatric/Behavioral: Negative for behavioral problems, confusion, sleep disturbance, dysphoric mood, decreased concentration and agitation.       Objective:   Physical Exam  Constitutional: He is oriented to person, place, and time. He appears well-developed and well-nourished. No distress.  HENT:  Head: Normocephalic and atraumatic.  Mouth/Throat: Oropharynx is clear and moist. No oropharyngeal exudate.  Eyes: Conjunctivae and EOM are normal. Pupils are equal, round, and reactive to light. No scleral icterus.  Neck: Normal range of motion. Neck supple. No JVD present.  Cardiovascular: Normal rate, regular rhythm and normal heart  sounds.  Exam reveals no gallop and no friction rub.   No murmur heard. Pulmonary/Chest: Effort normal and breath sounds normal. No respiratory distress. He has no wheezes. He has no rales. He exhibits no tenderness.  Abdominal: He exhibits no distension and no mass. There is no  tenderness. There is no rebound and no guarding.  Musculoskeletal: He exhibits no edema or tenderness.  Lymphadenopathy:    He has no cervical adenopathy.  Neurological: He is alert and oriented to person, place, and time. He exhibits normal muscle tone. Coordination normal.  Skin: Skin is warm and dry. He is not diaphoretic. No erythema. No pallor.  Psychiatric: He has a normal mood and affect. His behavior is normal. Judgment and thought content normal.          Assessment & Plan:  HIV: continue [place] rtc in 1 year  Need for vaccination for Tetanus: vaccine given. He had flu shot this fall at another site. He has immunity to hep A ab based on prior ab total for Hep A. Recheck Hep B titers  Depression: is doing well withoiut need for medications and did not find need to be seen by a counselor  ADHD: on vyvanse

## 2016-02-10 ENCOUNTER — Other Ambulatory Visit: Payer: Self-pay | Admitting: Infectious Disease

## 2016-02-10 DIAGNOSIS — B20 Human immunodeficiency virus [HIV] disease: Secondary | ICD-10-CM

## 2016-07-20 ENCOUNTER — Other Ambulatory Visit: Payer: Self-pay | Admitting: Internal Medicine

## 2016-07-20 DIAGNOSIS — B20 Human immunodeficiency virus [HIV] disease: Secondary | ICD-10-CM

## 2016-08-31 ENCOUNTER — Other Ambulatory Visit: Payer: Self-pay | Admitting: Internal Medicine

## 2016-08-31 DIAGNOSIS — B20 Human immunodeficiency virus [HIV] disease: Secondary | ICD-10-CM

## 2016-08-31 NOTE — Telephone Encounter (Signed)
Patient called for 1 month supply of medication.   Medication sent to pharmacy  Laverle Patter, RN

## 2016-09-13 ENCOUNTER — Other Ambulatory Visit: Payer: 59

## 2016-09-14 ENCOUNTER — Other Ambulatory Visit: Payer: 59

## 2016-09-14 DIAGNOSIS — B20 Human immunodeficiency virus [HIV] disease: Secondary | ICD-10-CM

## 2016-09-14 LAB — COMPLETE METABOLIC PANEL WITH GFR
ALT: 31 U/L (ref 9–46)
AST: 24 U/L (ref 10–40)
Albumin: 4.5 g/dL (ref 3.6–5.1)
Alkaline Phosphatase: 57 U/L (ref 40–115)
BUN: 13 mg/dL (ref 7–25)
CALCIUM: 9.7 mg/dL (ref 8.6–10.3)
CHLORIDE: 103 mmol/L (ref 98–110)
CO2: 27 mmol/L (ref 20–31)
Creat: 1.12 mg/dL (ref 0.60–1.35)
GFR, EST NON AFRICAN AMERICAN: 79 mL/min (ref >=60)
Glucose, Bld: 99 mg/dL (ref 65–99)
POTASSIUM: 4.7 mmol/L (ref 3.5–5.3)
Sodium: 138 mmol/L (ref 135–146)
Total Bilirubin: 0.5 mg/dL (ref 0.2–1.2)
Total Protein: 7.5 g/dL (ref 6.1–8.1)

## 2016-09-14 LAB — CBC WITH DIFFERENTIAL/PLATELET
Basophils Absolute: 51 cells/uL (ref 0–200)
Basophils Relative: 1 %
Eosinophils Absolute: 102 cells/uL (ref 15–500)
Eosinophils Relative: 2 %
HEMATOCRIT: 48.1 % (ref 38.5–50.0)
Hemoglobin: 16.7 g/dL (ref 13.2–17.1)
LYMPHS PCT: 39 %
Lymphs Abs: 1989 cells/uL (ref 850–3900)
MCH: 33.7 pg — ABNORMAL HIGH (ref 27.0–33.0)
MCHC: 34.7 g/dL (ref 32.0–36.0)
MCV: 97 fL (ref 80.0–100.0)
MPV: 10.3 fL (ref 7.5–12.5)
Monocytes Absolute: 510 cells/uL (ref 200–950)
Monocytes Relative: 10 %
NEUTROS PCT: 48 %
Neutro Abs: 2448 cells/uL (ref 1500–7800)
Platelets: 256 10*3/uL (ref 140–400)
RBC: 4.96 MIL/uL (ref 4.20–5.80)
RDW: 13.3 % (ref 11.0–15.0)
WBC: 5.1 10*3/uL (ref 3.8–10.8)

## 2016-09-14 LAB — LIPID PANEL
CHOL/HDL RATIO: 3.7 ratio (ref <5.0)
CHOLESTEROL: 168 mg/dL (ref <200)
HDL: 46 mg/dL (ref >40)
LDL Cholesterol: 108 mg/dL — ABNORMAL HIGH (ref <100)
TRIGLYCERIDES: 71 mg/dL (ref <150)
VLDL: 14 mg/dL (ref <30)

## 2016-09-14 LAB — HEPATITIS C ANTIBODY: HCV Ab: NEGATIVE

## 2016-09-15 LAB — RPR

## 2016-09-16 LAB — T-HELPER CELL (CD4) - (RCID CLINIC ONLY)
CD4 T CELL HELPER: 21 % — AB (ref 33–55)
CD4 T Cell Abs: 450 /uL (ref 400–2700)

## 2016-09-16 LAB — HIV-1 RNA QUANT-NO REFLEX-BLD
HIV 1 RNA QUANT: NOT DETECTED {copies}/mL
HIV-1 RNA Quant, Log: <1.3 Log copies/mL

## 2016-09-27 ENCOUNTER — Ambulatory Visit: Payer: 59 | Admitting: Infectious Disease

## 2016-09-29 ENCOUNTER — Encounter: Payer: Self-pay | Admitting: Infectious Disease

## 2016-09-29 ENCOUNTER — Ambulatory Visit (INDEPENDENT_AMBULATORY_CARE_PROVIDER_SITE_OTHER): Payer: 59 | Admitting: Infectious Disease

## 2016-09-29 ENCOUNTER — Other Ambulatory Visit: Payer: Self-pay | Admitting: Infectious Disease

## 2016-09-29 VITALS — BP 124/76 | HR 93 | Temp 98.0°F | Ht 71.0 in | Wt 247.0 lb

## 2016-09-29 DIAGNOSIS — B20 Human immunodeficiency virus [HIV] disease: Secondary | ICD-10-CM

## 2016-09-29 DIAGNOSIS — Z23 Encounter for immunization: Secondary | ICD-10-CM

## 2016-09-29 DIAGNOSIS — R41840 Attention and concentration deficit: Secondary | ICD-10-CM

## 2016-09-29 DIAGNOSIS — Z8659 Personal history of other mental and behavioral disorders: Secondary | ICD-10-CM

## 2016-09-29 MED ORDER — BICTEGRAVIR-EMTRICITAB-TENOFOV 50-200-25 MG PO TABS: 1.0000 | ORAL_TABLET | Freq: Every day | ORAL | 11 refills | Status: DC

## 2016-09-29 MED ORDER — ABACAVIR-DOLUTEGRAVIR-LAMIVUD 600-50-300 MG PO TABS: 1.0000 | ORAL_TABLET | Freq: Every day | ORAL | 3 refills | Status: DC

## 2016-09-29 NOTE — Progress Notes (Signed)
HPI: Tommy Galvan is a 46 y.o. male who is here for his HIV follow up visit.   Allergies: Allergies  Allergen Reactions  . Penicillins     REACTION: Reaction unknown; remote    Vitals: Temp: 98 F (36.7 C) (03/14 1423) Temp Source: Oral (03/14 1423) BP: 124/76 (03/14 1423) Pulse Rate: 93 (03/14 1423)  Past Medical History: Past Medical History:  Diagnosis Date  . Depression   . HIV disease ([place]) 10/06/2015  . 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 HIV infection [facility])     Social History: Social History   Social History  . Marital status: Single    Spouse name: N/A  . Number of children: N/A  . Years of education: N/A   Social History Main Topics  . Smoking status: Never Smoker  . Smokeless tobacco: Never Used  . Alcohol use No  . Drug use: No  . Sexual activity: Yes    Partners: Male     Comment: pt. declined condoms   Other Topics Concern  . None   Social History Narrative  . None    Previous Regimen: DRV/r, KLT, CBV, EPZ, DTG  Current Regimen: Triumeq  Labs: HIV 1 RNA Quant (copies/mL)  Date Value  09/14/2016 <20 NOT DETECTED  10/17/2014 <20  10/03/2013 <20   CD4 T Cell Abs (/uL)  Date Value  09/14/2016 450  10/17/2014 440  10/03/2013 450   Hep B S Ab (no units)  Date Value  09/12/2006 NO   Hepatitis B Surface Ag (no units)  Date Value  10/17/2014 NEGATIVE   HCV Ab (no units)  Date Value  09/14/2016 NEGATIVE    CrCl: Estimated Creatinine Clearance: 106 mL/min (by C-G formula based on SCr of 1.12 mg/dL).  Lipids:    Component Value Date/Time   CHOL 168 09/14/2016 1033   TRIG 71 09/14/2016 1033   HDL 46 09/14/2016 1033   CHOLHDL 3.7 09/14/2016 1033   VLDL 14 09/14/2016 1033   LDLCALC 108 (H) 09/14/2016 1033    Assessment: Tommy Galvan is well suppressed on Triumeq. Dr. Tommy Medal was trying to change him to [place] but his insurance [facility]) is not covering it yet. Some plan has been covering but others have not. Therefore, he is going to  come back and see me in 3 mo to try again.   Recommendations:  Cont Triumeq for now F/u in 3 mo to see if we can change to [facility], PharmD, BCPS, AAHIVP, CPP Clinical [facility] [place] [facility] 09/29/2016, 3:00 PM

## 2016-09-29 NOTE — Progress Notes (Signed)
Subjective:   Chief complaint: followup for HIV on medications   Patient ID: Tommy Galvan, male    DOB: 1971-06-03, 46 y.o.   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 Time he was tried initially on Sustiva and brought out but was changed to Kaletra and Combivir I believe for better CNS penetration of the second regimen. In any case he responded well to that regimen and responded well to kaletra epzicom and then Prezista Norvir and epzicom and has had undetectable viral loads for years.  We changed him last February to  2 tivicay and epzico and  then changed to [facility].  He has continued perfect virological suppression. He asked what was the " latest drug" and I went over [facility] with him since it does not have ABC and there is controversy over CV risk. Unfortunately for him he cannot yet be switched to this medication because his insurance does not yet cover it.  We also discussed the REPRIEVE study and I had him meet with Maudie Mercury.  Lab Results  Component Value Date   HIV1RNAQUANT <20 NOT DETECTED 09/14/2016   HIV1RNAQUANT <20 10/17/2014   HIV1RNAQUANT <20 10/03/2013     Lab Results  Component Value Date   CD4TABS 450 09/14/2016   CD4TABS 440 10/17/2014   CD4TABS 450 10/03/2013   Past Medical History:  Diagnosis Date  . Depression   . HIV disease ([place]) 10/06/2015  . 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 HIV infection ([place])     No past surgical history on file.  No family history on file.    Social History   Social History  . Marital status: Single    Spouse name: N/A  . Number of children: N/A  . Years of education: N/A   Social History Main Topics  . Smoking status: Never Smoker  . Smokeless tobacco: Never Used  . Alcohol use No  . Drug use: No  . Sexual activity: Yes    Partners: Male     Comment: pt. declined condoms   Other Topics Concern   . None   Social History Narrative  . None    Allergies  Allergen Reactions  . Penicillins     REACTION: Reaction unknown; remote     Current Outpatient Prescriptions:  .  TRIUMEQ 600-50-300 MG tablet, TAKE 1 TABLET BY MOUTH EVERY DAY, Disp: 30 tablet, Rfl: 0    Review of Systems  Constitutional: Negative for activity change, appetite change, chills, diaphoresis, fever and unexpected weight change.  HENT: Negative for congestion, rhinorrhea, sinus pressure, sneezing, sore throat and trouble swallowing.   Eyes: Negative for photophobia and visual disturbance.  Respiratory: Negative for cough, chest tightness, shortness of breath, wheezing and stridor.   Cardiovascular: Negative for chest pain, palpitations and leg swelling.  Gastrointestinal: Negative for abdominal distention, abdominal pain, anal bleeding, blood in stool, constipation, diarrhea, nausea and vomiting.  Genitourinary: Negative for difficulty urinating, dysuria, flank pain and hematuria.  Musculoskeletal: Negative for arthralgias, back pain, gait problem, joint swelling and myalgias.  Skin: Negative for color change, pallor, rash and wound.  Neurological: Negative for dizziness, tremors, weakness and light-headedness.  Hematological: Negative for adenopathy. Does not bruise/bleed easily.  Psychiatric/Behavioral: Negative for agitation, behavioral problems, confusion, decreased concentration, dysphoric mood and sleep disturbance.       Objective:   Physical Exam  Constitutional: He is oriented to  person, place, and time. He appears well-developed and well-nourished. No distress.  HENT:  Head: Normocephalic and atraumatic.  Mouth/Throat: Oropharynx is clear and moist. No oropharyngeal exudate.  Eyes: Conjunctivae and EOM are normal. Pupils are equal, round, and reactive to light. No scleral icterus.  Neck: Normal range of motion. Neck supple. No JVD present.  Cardiovascular: Normal rate, regular rhythm and normal  heart sounds.  Exam reveals no gallop and no friction rub.   No murmur heard. Pulmonary/Chest: Effort normal and breath sounds normal. No respiratory distress. He has no wheezes. He has no rales. He exhibits no tenderness.  Abdominal: He exhibits no distension and no mass. There is no tenderness. There is no rebound and no guarding.  Musculoskeletal: He exhibits no edema or tenderness.  Lymphadenopathy:    He has no cervical adenopathy.  Neurological: He is alert and oriented to person, place, and time. He exhibits normal muscle tone. Coordination normal.  Skin: Skin is warm and dry. He is not diaphoretic. No erythema. No pallor.  Psychiatric: He has a normal mood and affect. His behavior is normal. Judgment and thought content normal.          Assessment & Plan:  HIV: continue TRIUMEQ rtc in a few months to meet with Minh and see if [name] is covered  CV risk: would be good candidate for REPRIEVE if interested.    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

## 2016-12-30 ENCOUNTER — Ambulatory Visit: Payer: 59

## 2016-12-31 ENCOUNTER — Other Ambulatory Visit: Payer: Self-pay | Admitting: Pharmacist Clinician (PhC)/ Clinical Pharmacy Specialist

## 2016-12-31 MED ORDER — BICTEGRAVIR-EMTRICITAB-TENOFOV 50-200-25 MG PO TABS: 1.0000 | ORAL_TABLET | Freq: Every day | ORAL | 11 refills | Status: DC

## 2016-12-31 NOTE — Progress Notes (Signed)
Amazingly, they overrode the formulary exclusion for Biktarvy. Based on his plan, it can't be filled at [facility]. Called him today to give him the good news. He was very excited and appreciative about the approval. Bring him back in 6 wks to repeat labs. Told him on how to activate copay card.

## 2017-01-02 NOTE — Progress Notes (Signed)
Thanks Minh! 

## 2017-01-04 ENCOUNTER — Telehealth: Payer: Self-pay | Admitting: *Deleted

## 2017-01-04 NOTE — Telephone Encounter (Signed)
PA for Biktarvy approved through 12/31/17

## 2017-01-05 ENCOUNTER — Other Ambulatory Visit: Payer: Self-pay | Admitting: *Deleted

## 2017-01-05 MED ORDER — BICTEGRAVIR-EMTRICITAB-TENOFOV 50-200-25 MG PO TABS: 1.0000 | ORAL_TABLET | Freq: Every day | ORAL | 3 refills | Status: DC

## 2017-02-24 ENCOUNTER — Ambulatory Visit: Payer: 59

## 2017-07-24 ENCOUNTER — Other Ambulatory Visit: Payer: Self-pay

## 2017-07-24 ENCOUNTER — Ambulatory Visit (HOSPITAL_COMMUNITY)
Admission: EM | Admit: 2017-07-24 | Discharge: 2017-07-24 | Disposition: A | Discharge status: Home / Self Care | Payer: 59 | Attending: Emergency Medicine | Admitting: Emergency Medicine

## 2017-07-24 ENCOUNTER — Encounter (HOSPITAL_COMMUNITY): Payer: Self-pay | Admitting: *Deleted

## 2017-07-24 DIAGNOSIS — K529 Noninfective gastroenteritis and colitis, unspecified: Secondary | ICD-10-CM | POA: Diagnosis not present

## 2017-07-24 DIAGNOSIS — R112 Nausea with vomiting, unspecified: Secondary | ICD-10-CM | POA: Diagnosis not present

## 2017-07-24 DIAGNOSIS — R000 Tachycardia, unspecified: Secondary | ICD-10-CM | POA: Diagnosis not present

## 2017-07-24 DIAGNOSIS — E860 Dehydration: Secondary | ICD-10-CM | POA: Diagnosis not present

## 2017-07-24 LAB — POCT URINALYSIS DIP (DEVICE)
Bilirubin Urine: NEGATIVE
GLUCOSE, UA: NEGATIVE mg/dL
Ketones, ur: NEGATIVE mg/dL
LEUKOCYTES UA: NEGATIVE
Nitrite: NEGATIVE
Protein, ur: NEGATIVE mg/dL
SPECIFIC GRAVITY, URINE: 1.025 (ref 1.005–1.030)
UROBILINOGEN UA: 0.2 mg/dL (ref 0.0–1.0)
pH: 6 (ref 5.0–8.0)

## 2017-07-24 LAB — POCT I-STAT, CHEM 8
BUN: 19 mg/dL (ref 6–20)
CALCIUM ION: 1.07 mmol/L — AB (ref 1.15–1.40)
CHLORIDE: 103 mmol/L (ref 101–111)
Creatinine, Ser: 1.1 mg/dL (ref 0.61–1.24)
Glucose, Bld: 154 mg/dL — ABNORMAL HIGH (ref 65–99)
HCT: 54 % — ABNORMAL HIGH (ref 39.0–52.0)
Hemoglobin: 18.4 g/dL — ABNORMAL HIGH (ref 13.0–17.0)
POTASSIUM: 4.2 mmol/L (ref 3.5–5.1)
SODIUM: 139 mmol/L (ref 135–145)
TCO2: 24 mmol/L (ref 22–32)

## 2017-07-24 MED ORDER — ONDANSETRON HCL 4 MG/2ML IJ SOLN: 4.0000 mg | Freq: Once | INTRAMUSCULAR | Status: DC

## 2017-07-24 MED ORDER — ONDANSETRON HCL 4 MG/2ML IJ SOLN
4.0000 mg | Freq: Once | INTRAMUSCULAR | Status: AC
  Administered 2017-07-24: 4 mg via INTRAVENOUS

## 2017-07-24 MED ORDER — ACETAMINOPHEN 500 MG PO TABS: 1000.0000 mg | ORAL_TABLET | Freq: Three times a day (TID) | ORAL | 99 refills | Status: DC | PRN

## 2017-07-24 MED ORDER — SODIUM CHLORIDE 0.9 % IV BOLUS (SEPSIS)
1000.0000 mL | Freq: Once | INTRAVENOUS | Status: AC
  Administered 2017-07-24: 1000 mL via INTRAVENOUS

## 2017-07-24 MED ORDER — ONDANSETRON HCL 4 MG/2ML IJ SOLN
INTRAMUSCULAR | Status: AC
  Filled 2017-07-24: qty 2

## 2017-07-24 MED ORDER — ONDANSETRON HCL 4 MG PO TABS: 4.0000 mg | ORAL_TABLET | Freq: Four times a day (QID) | ORAL | 0 refills | Status: DC

## 2017-07-24 NOTE — ED Triage Notes (Signed)
C/O multiple episodes vomiting and diarrhea with intermittent sharp abd pains since last night with constant abd soreness.  Unsure if fevers.

## 2017-07-24 NOTE — ED Notes (Signed)
States feeling a little better.  Nausea at a minimum, only slight HA left.  Abd "feels more like a soreness from vomiting".

## 2017-07-24 NOTE — ED Notes (Signed)
Patient is receiving IV fluids and is unable to provide urine specimen at this time

## 2017-07-24 NOTE — Discharge Instructions (Signed)
Start the zofran and follow the provided diet for a few day. Okay to take tylenol for any generalized abdominal pain. Come back if you have any blood in the diarrhea.  Go to ED if you begin to have focal abdominal pain, worsening fever, dizziness.

## 2017-07-24 NOTE — ED Provider Notes (Addendum)
07/24/2017 2:18 PM   DOB: 1971/01/04 / MRN: [id number]  SUBJECTIVE:  Tommy Galvan is a 47 y.o. male with a history of well controlled HIV disease complinaty with current therapy presenting for nausea, non bloody vomitting, and non bloody diarrhea. Tells me that this started last night at about 1 am with sharp localized abdominal pain in the right lower quadrant and has since spread to the entire abdomen and is now dull and achy. He is status post appendectomy and has no history of diverticulitis.   He is allergic to penicillins.   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    He  reports that  has never smoked. he has never used smokeless tobacco. He reports that he does not drink alcohol or use drugs. He  reports that he currently engages in sexual activity and has had partners who are Male. The patient  has a past surgical history that includes Appendectomy.  His family history is not on file.  Review of Systems  Constitutional: Negative for chills and fever.  Respiratory: Negative for cough.   Gastrointestinal: Positive for abdominal pain, diarrhea, nausea and vomiting. Negative for blood in stool, constipation, heartburn and melena.  Skin: Negative for itching and rash.  Neurological: Negative for dizziness and headaches.    OBJECTIVE:  BP 135/79   Pulse (!) 105   Temp 99 F (37.2 C) (Oral)   Resp 20   SpO2 96%   Wt Readings from Last 3 Encounters:  09/29/16 247 lb (112 kg)  10/06/15 214 lb (97.1 kg)  11/05/14 250 lb (113.4 kg)   Temp Readings from Last 3 Encounters:  07/24/17 99 F (37.2 C) (Oral)  09/29/16 98 F (36.7 C) (Oral)  10/06/15 97.9 F (36.6 C) (Oral)   BP Readings from Last 3 Encounters:  07/24/17 135/79  09/29/16 124/76  10/06/15 128/82   Pulse Readings from Last 3 Encounters:  07/24/17 (!) 105  09/29/16 93  10/06/15 68     Lab Results  Component Value  Date   CD4TCELL 21 (L) 09/14/2016   CD4TABS 450 09/14/2016   Lab Results  Component Value Date   HIV1RNAQUANT <20 NOT DETECTED 09/14/2016    Physical Exam  Constitutional: He appears well-developed. He is active and cooperative.  Non-toxic appearance.  Cardiovascular: Normal rate, regular rhythm, S1 normal, S2 normal, normal heart sounds, intact distal pulses and normal pulses. Exam reveals no gallop and no friction rub.  No murmur heard. Pulmonary/Chest: Effort normal. No stridor. No tachypnea. No respiratory distress. He has no wheezes. He has no rales.  Abdominal: He exhibits no distension. There is tenderness (mild, non focal).  Musculoskeletal: He exhibits no edema.  Neurological: He is alert.  Skin: Skin is warm and dry. He is not diaphoretic. No pallor.  Vitals reviewed.   Results for orders placed or performed during the hospital encounter of 07/24/17 (from the past 72 hour(s))  I-STAT, chem 8     Status: Abnormal   Collection Time: 07/24/17  1:24 PM  Result Value Ref Range   Sodium 139 135 - 145 mmol/L   Potassium 4.2 3.5 - 5.1 mmol/L   Chloride 103 101 - 111 mmol/L   BUN 19 6 - 20 mg/dL   Creatinine, Ser 1.10 0.61 - 1.24 mg/dL   Glucose, Bld 154 (H) 65 - 99 mg/dL   Calcium, Ion 1.07 (L) 1.15 - 1.40 mmol/L   TCO2 24  22 - 32 mmol/L   Hemoglobin 18.4 (H) 13.0 - 17.0 g/dL   HCT 54.0 (H) 39.0 - 52.0 %  POCT urinalysis dip (device)     Status: Abnormal   Collection Time: 07/24/17  1:54 PM  Result Value Ref Range   Glucose, UA NEGATIVE NEGATIVE mg/dL   Bilirubin Urine NEGATIVE NEGATIVE   Ketones, ur NEGATIVE NEGATIVE mg/dL   Specific Gravity, Urine 1.025 1.005 - 1.030   Hgb urine dipstick TRACE (A) NEGATIVE   pH 6.0 5.0 - 8.0   Protein, ur NEGATIVE NEGATIVE mg/dL   Urobilinogen, UA 0.2 0.0 - 1.0 mg/dL   Nitrite NEGATIVE NEGATIVE   Leukocytes, UA NEGATIVE NEGATIVE    Comment: Biochemical Testing Only. Please order routine urinalysis from main lab if confirmatory  testing is needed.    No results found.  ASSESSMENT AND PLAN:  Gastroenteritis - Likely viral or toxin mediated.  He feels better with fluids and zofran here.  Repeat exam non focal. Discharge home in good condition with ED precautions.  Dehydration  Tachycardia: Improved with fluids.      The patient is advised to call or return to clinic if he does not see an improvement in symptoms, or to seek the care of the closest emergency department if he worsens with the above plan.   Philis Fendt, MHS, PA-C 07/24/2017 2:18 PM    Tereasa Coop, PA-C 07/24/17 1418    Tereasa Coop, PA-C 07/24/17 1422

## 2017-07-24 NOTE — ED Notes (Signed)
Pt declines ondansetron until provider eval.  Ice chips provided.  States able to keep down sips of ginger ale & Gatorade.

## 2017-12-17 ENCOUNTER — Other Ambulatory Visit: Payer: Self-pay | Admitting: Infectious Disease

## 2018-01-03 ENCOUNTER — Other Ambulatory Visit: Payer: Self-pay | Admitting: *Deleted

## 2018-01-03 DIAGNOSIS — B20 Human immunodeficiency virus [HIV] disease: Secondary | ICD-10-CM

## 2018-01-03 MED ORDER — BICTEGRAVIR-EMTRICITAB-TENOFOV 50-200-25 MG PO TABS: 1.0000 | ORAL_TABLET | Freq: Every day | ORAL | 0 refills | Status: DC

## 2018-01-11 ENCOUNTER — Encounter: Payer: Self-pay | Admitting: Infectious Disease

## 2018-01-11 ENCOUNTER — Other Ambulatory Visit (HOSPITAL_COMMUNITY)
Admission: RE | Admit: 2018-01-11 | Discharge: 2018-01-11 | Disposition: A | Discharge status: Home / Self Care | Payer: 59 | Source: Ambulatory Visit | Attending: Infectious Disease | Admitting: Infectious Disease

## 2018-01-11 ENCOUNTER — Ambulatory Visit: Payer: 59 | Admitting: Infectious Disease

## 2018-01-11 VITALS — BP 127/85 | HR 76 | Ht 71.0 in | Wt 245.0 lb

## 2018-01-11 DIAGNOSIS — B20 Human immunodeficiency virus [HIV] disease: Secondary | ICD-10-CM | POA: Diagnosis present

## 2018-01-11 DIAGNOSIS — F909 Attention-deficit hyperactivity disorder, unspecified type: Secondary | ICD-10-CM

## 2018-01-11 DIAGNOSIS — Z23 Encounter for immunization: Secondary | ICD-10-CM

## 2018-01-11 MED ORDER — BICTEGRAVIR-EMTRICITAB-TENOFOV 50-200-25 MG PO TABS: 1.0000 | ORAL_TABLET | Freq: Every day | ORAL | 11 refills | Status: DC

## 2018-01-11 NOTE — Progress Notes (Signed)
Subjective:   Chief complaint: followup for HIV on medications   Patient ID: Tommy Galvan, male    DOB: [birth year], 47 y.o.   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 Time he was tried initially on Sustiva and brought out but was changed to Kaletra and Combivir I believe for better CNS penetration of the second regimen. In any case he responded well to that regimen and responded well to kaletra epzicom and then Prezista Norvir and epzicom and has had undetectable viral loads for years.  We changed him last to  2 tivicay and epzico and  then changed to [place] --> BIKTARBVY which he is tolerating without problems.  Lab Results  Component Value Date   HIV1RNAQUANT <20 NOT DETECTED 09/14/2016   HIV1RNAQUANT <20 10/17/2014   HIV1RNAQUANT <20 10/03/2013     Lab Results  Component Value Date   CD4TABS 450 09/14/2016   CD4TABS 440 10/17/2014   CD4TABS 450 10/03/2013   Past Medical History:  Diagnosis Date  . Depression   . HIV disease ([place]) 10/06/2015  . 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 HIV infection [facility])     Past Surgical History:  Procedure Laterality Date  . APPENDECTOMY      No family history on file.    Social History   Socioeconomic History  . Marital status: Single    Spouse name: Not on file  . Number of children: Not on file  . Years of education: Not on file  . Highest education level: Not on file  Occupational History  . Not on file  Social Needs  . Financial resource strain: Not on file  . Food insecurity:    Worry: Not on file    Inability: Not on file  . Transportation needs:    Medical: Not on file    Non-medical: Not on file  Tobacco Use  . Smoking status: Never Smoker  . Smokeless tobacco: Never Used  Substance and Sexual Activity  . Alcohol use: No  . Drug use: No  . Sexual activity: Yes    Partners: Male   Lifestyle  . Physical activity:    Days per week: Not on file    Minutes per session: Not on file  . Stress: Not on file  Relationships  . Social connections:    Talks on phone: Not on file    Gets together: Not on file    Attends religious service: Not on file    Active member of club or organization: Not on file    Attends meetings of clubs or organizations: Not on file    Relationship status: Not on file  Other Topics Concern  . Not on file  Social History Narrative  . Not on file    Allergies  Allergen Reactions  . Penicillins     REACTION: Reaction unknown; remote     Current Outpatient Medications:  .  acetaminophen (TYLENOL) 500 MG tablet, Take 2 tablets (1,000 mg total) by mouth every 8 (eight) hours as needed for mild pain, moderate pain or fever. Take 2 tabs every 8 hours., Disp: 90 tablet, Rfl: prn .  bictegravir-emtricitabine-tenofovir AF (BIKTARVY) 50-200-25 MG TABS tablet, Take 1 tablet by mouth daily., Disp: 30 tablet, Rfl: 0 .  ondansetron (ZOFRAN) 4 MG tablet, Take 1 tablet (4 mg total) by mouth every 6 (six) hours., Disp: 20 tablet, Rfl:  0    Review of Systems  Constitutional: Negative for activity change, appetite change, chills, diaphoresis, fever and unexpected weight change.  HENT: Negative for congestion, rhinorrhea, sinus pressure, sneezing, sore throat and trouble swallowing.   Eyes: Negative for photophobia and visual disturbance.  Respiratory: Negative for cough, chest tightness, shortness of breath, wheezing and stridor.   Cardiovascular: Negative for chest pain, palpitations and leg swelling.  Gastrointestinal: Negative for abdominal distention, abdominal pain, anal bleeding, blood in stool, constipation, diarrhea, nausea and vomiting.  Genitourinary: Negative for difficulty urinating, dysuria, flank pain and hematuria.  Musculoskeletal: Negative for arthralgias, back pain, gait problem, joint swelling and myalgias.  Skin: Negative for color  change, pallor, rash and wound.  Neurological: Negative for dizziness, tremors, weakness and light-headedness.  Hematological: Negative for adenopathy. Does not bruise/bleed easily.  Psychiatric/Behavioral: Negative for agitation, behavioral problems, confusion, decreased concentration, dysphoric mood and sleep disturbance.       Objective:   Physical Exam  Constitutional: He is oriented to person, place, and time. He appears well-developed and well-nourished. No distress.  HENT:  Head: Normocephalic and atraumatic.  Mouth/Throat: Oropharynx is clear and moist. No oropharyngeal exudate.  Eyes: Pupils are equal, round, and reactive to light. Conjunctivae and EOM are normal. No scleral icterus.  Neck: Normal range of motion. Neck supple. No JVD present.  Cardiovascular: Normal rate, regular rhythm and normal heart sounds. Exam reveals no gallop and no friction rub.  No murmur heard. Pulmonary/Chest: Effort normal and breath sounds normal. No respiratory distress. He has no wheezes. He has no rales. He exhibits no tenderness.  Abdominal: He exhibits no distension and no mass. There is no tenderness. There is no rebound and no guarding.  Musculoskeletal: He exhibits no edema or tenderness.  Lymphadenopathy:    He has no cervical adenopathy.  Neurological: He is alert and oriented to person, place, and time. He exhibits normal muscle tone. Coordination normal.  Skin: Skin is warm and dry. He is not diaphoretic. No erythema. No pallor.  Psychiatric: He has a normal mood and affect. His behavior is normal. Judgment and thought content normal.          Assessment & Plan:  HIV: continue BIKTARVY.  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

## 2018-01-12 LAB — LIPID PANEL
CHOL/HDL RATIO: 4 (calc) (ref <5.0)
CHOLESTEROL: 181 mg/dL (ref <200)
HDL: 45 mg/dL (ref >40)
LDL CHOLESTEROL (CALC): 116 mg/dL — AB
Non-HDL Cholesterol (Calc): 136 mg/dL (calc) — ABNORMAL HIGH (ref <130)
TRIGLYCERIDES: 92 mg/dL (ref <150)

## 2018-01-12 LAB — COMPLETE METABOLIC PANEL WITH GFR
AG Ratio: 1.7 (calc) (ref 1.0–2.5)
ALT: 36 U/L (ref 9–46)
AST: 25 U/L (ref 10–40)
Albumin: 4.7 g/dL (ref 3.6–5.1)
Alkaline phosphatase (APISO): 80 U/L (ref 40–115)
BUN: 12 mg/dL (ref 7–25)
CALCIUM: 9.8 mg/dL (ref 8.6–10.3)
CO2: 28 mmol/L (ref 20–32)
CREATININE: 1.13 mg/dL (ref 0.60–1.35)
Chloride: 103 mmol/L (ref 98–110)
GFR, EST NON AFRICAN AMERICAN: 78 mL/min/{1.73_m2} (ref >=60)
GFR, Est African American: 90 mL/min/{1.73_m2} (ref >=60)
GLOBULIN: 2.8 g/dL (ref 1.9–3.7)
GLUCOSE: 103 mg/dL — AB (ref 65–99)
Potassium: 4.5 mmol/L (ref 3.5–5.3)
Sodium: 139 mmol/L (ref 135–146)
Total Bilirubin: 0.7 mg/dL (ref 0.2–1.2)
Total Protein: 7.5 g/dL (ref 6.1–8.1)

## 2018-01-12 LAB — CBC WITH DIFFERENTIAL/PLATELET
BASOS PCT: 1.1 %
Basophils Absolute: 61 cells/uL (ref 0–200)
EOS ABS: 160 {cells}/uL (ref 15–500)
Eosinophils Relative: 2.9 %
HCT: 45.8 % (ref 38.5–50.0)
HEMOGLOBIN: 16 g/dL (ref 13.2–17.1)
LYMPHS ABS: 1848 {cells}/uL (ref 850–3900)
MCH: 32.5 pg (ref 27.0–33.0)
MCHC: 34.9 g/dL (ref 32.0–36.0)
MCV: 92.9 fL (ref 80.0–100.0)
MPV: 10.5 fL (ref 7.5–12.5)
Monocytes Relative: 11.6 %
NEUTROS ABS: 2794 {cells}/uL (ref 1500–7800)
Neutrophils Relative %: 50.8 %
Platelets: 264 10*3/uL (ref 140–400)
RBC: 4.93 10*6/uL (ref 4.20–5.80)
RDW: 13.3 % (ref 11.0–15.0)
Total Lymphocyte: 33.6 %
WBC: 5.5 10*3/uL (ref 3.8–10.8)
WBCMIX: 638 {cells}/uL (ref 200–950)

## 2018-01-12 LAB — RPR: RPR Ser Ql: NONREACTIVE

## 2018-01-12 LAB — URINE CYTOLOGY ANCILLARY ONLY
Chlamydia: NEGATIVE
Neisseria Gonorrhea: NEGATIVE

## 2018-01-12 LAB — T-HELPER CELL (CD4) - (RCID CLINIC ONLY)
CD4 % Helper T Cell: 22 % — ABNORMAL LOW (ref 33–55)
CD4 T Cell Abs: 440 /uL (ref 400–2700)

## 2018-01-13 LAB — HIV-1 RNA QUANT-NO REFLEX-BLD
HIV 1 RNA QUANT: NOT DETECTED {copies}/mL
HIV-1 RNA Quant, Log: <1.3 Log copies/mL

## 2018-02-02 ENCOUNTER — Other Ambulatory Visit: Payer: Self-pay

## 2018-02-02 ENCOUNTER — Ambulatory Visit: Payer: 59 | Admitting: Physician Assistant

## 2018-02-02 ENCOUNTER — Encounter: Payer: Self-pay | Admitting: Physician Assistant

## 2018-02-02 VITALS — BP 110/60 | HR 73 | Temp 98.0°F | Resp 17 | Ht 70.28 in | Wt 242.0 lb

## 2018-02-02 DIAGNOSIS — Z6834 Body mass index (BMI) 34.0-34.9, adult: Secondary | ICD-10-CM | POA: Diagnosis not present

## 2018-02-02 DIAGNOSIS — E6609 Other obesity due to excess calories: Secondary | ICD-10-CM | POA: Diagnosis not present

## 2018-02-02 NOTE — Patient Instructions (Signed)
     IF you received an x-ray today, you will receive an invoice from [facility]. Please contact [facility] at [phone] with questions or concerns regarding your invoice.   IF you received labwork today, you will receive an invoice from [facility]. Please contact [facility] at [phone] with questions or concerns regarding your invoice.   Our billing staff will not be able to assist you with questions regarding bills from these companies.  You will be contacted with the lab results as soon as they are available. The fastest way to get your results is to activate your My Chart account. Instructions are located on the last page of this paperwork. If you have not heard from us regarding the results in 2 weeks, please contact this office.     

## 2018-02-02 NOTE — Progress Notes (Signed)
02/02/2018 4:10 PM   DOB: 03-15-1971 / MRN: [id number]  SUBJECTIVE:  Tommy Galvan is a 47 y.o. male presenting to establish care.   No family history of colon cancer.  Strokes do run in his family.  No MI's. He has a 14 year history of well controlled diabetes. Never smoker.  No alcohol. He works in Engineer, production. He likes to exercise 3-4 days a weeks. Has cut out fast food.   Immunization History  Administered Date(s) Administered  . H1N1 07/22/2008  . Hepatitis A, Adult 11/05/2014  . Hepatitis B 07/02/2004, 08/10/2004, 11/23/2004  . Influenza Inj Mdck Quad Pf 04/22/2017  . Influenza Split 04/19/2011, 06/06/2012  . Influenza Whole 06/06/2006, 05/10/2007, 05/11/2007, 04/16/2008, 04/18/2010  . Influenza, Seasonal, Injecte, Preservative Fre 05/20/2015  . Influenza-Unspecified 05/02/2016  . Meningococcal Mcv4o 09/29/2016  . Pneumococcal Polysaccharide-23 06/29/2004, 07/29/2010  . Tdap 10/06/2015     Depression screen PHQ 2/9 02/02/2018  Decreased Interest 0  Down, Depressed, Hopeless 0  PHQ - 2 Score 0     He is allergic to penicillins.   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    He  reports that he has never smoked. He has never used smokeless tobacco. He reports that he does not drink alcohol or use drugs. He  reports that he currently engages in sexual activity and has had partners who are Male. The patient  has a past surgical history that includes Appendectomy.  His family history includes Cancer in his father, maternal grandfather, and paternal grandfather; Diabetes in his maternal grandmother; Heart disease in his paternal grandmother; Hyperlipidemia in his father, maternal grandfather, and paternal grandmother; Hypertension in his maternal grandfather and paternal grandmother; Mental illness in his brother; Stroke in his maternal grandfather and paternal  grandmother.  Review of Systems  Constitutional: Negative for chills, diaphoresis and fever.  Eyes: Negative.   Respiratory: Negative for cough, hemoptysis, sputum production, shortness of breath and wheezing.   Cardiovascular: Negative for chest pain, orthopnea and leg swelling.  Gastrointestinal: Negative for abdominal pain, blood in stool, constipation, diarrhea, heartburn, melena, nausea and vomiting.  Genitourinary: Negative for dysuria, flank pain, frequency, hematuria and urgency.  Skin: Negative for rash.  Neurological: Negative for dizziness, sensory change, speech change, focal weakness and headaches.    The problem list and medications were reviewed and updated by myself where necessary and exist elsewhere in the encounter.   OBJECTIVE:  BP 110/60 (BP Location: Left Arm, Patient Position: Sitting, Cuff Size: Large)   Pulse 73   Temp 98 F (36.7 C) (Oral)   Resp 17   Ht 5' 10.28" (1.785 m)   Wt 242 lb (109.8 kg)   SpO2 94%   BMI 34.45 kg/m   Wt Readings from Last 3 Encounters:  02/02/18 242 lb (109.8 kg)  01/11/18 245 lb (111.1 kg)  09/29/16 247 lb (112 kg)   Temp Readings from Last 3 Encounters:  02/02/18 98 F (36.7 C) (Oral)  07/24/17 99 F (37.2 C) (Oral)  09/29/16 98 F (36.7 C) (Oral)   BP Readings from Last 3 Encounters:  02/02/18 110/60  01/11/18 127/85  07/24/17 135/79   Pulse Readings from Last 3 Encounters:  02/02/18 73  01/11/18 76  07/24/17 (!) 105    Physical Exam  Constitutional: He is oriented to person, place, and time. He appears well-developed. He does not appear ill.  Eyes: Pupils are equal,  round, and reactive to light. Conjunctivae and EOM are normal.  Cardiovascular: Normal rate.  Pulmonary/Chest: Effort normal.  Abdominal: He exhibits no distension.  Musculoskeletal: Normal range of motion.  Neurological: He is alert and oriented to person, place, and time. No cranial nerve deficit. Coordination normal.  Skin: Skin is warm  and dry. He is not diaphoretic.  Psychiatric: He has a normal mood and affect.  Nursing note and vitals reviewed.   No results found for: HGBA1C  Lab Results  Component Value Date   WBC 5.5 01/11/2018   HGB 16.0 01/11/2018   HCT 45.8 01/11/2018   MCV 92.9 01/11/2018   PLT 264 01/11/2018    Lab Results  Component Value Date   CREATININE 1.13 01/11/2018   BUN 12 01/11/2018   NA 139 01/11/2018   K 4.5 01/11/2018   CL 103 01/11/2018   CO2 28 01/11/2018    Lab Results  Component Value Date   ALT 36 01/11/2018   AST 25 01/11/2018   ALKPHOS 57 09/14/2016   BILITOT 0.7 01/11/2018   Lab Results  Component Value Date   CHOL 181 01/11/2018   HDL 45 01/11/2018   LDLCALC 116 (H) 01/11/2018   TRIG 92 01/11/2018   CHOLHDL 4.0 01/11/2018   The 10-year ASCVD risk score Mikey Bussing DC Jr., et al., 2013) is: 1.7%   Values used to calculate the score:     Age: 37 years     Sex: Male     Is Non-Hispanic African American: No     Diabetic: No     Tobacco smoker: No     Systolic Blood Pressure: 017 mmHg     Is BP treated: No     HDL Cholesterol: 45 mg/dL     Total Cholesterol: 181 mg/dL   ASSESSMENT AND PLAN:  There are no diagnoses linked to this encounter.  The patient is advised to call or return to clinic if he does not see an improvement in symptoms, or to seek the care of the closest emergency department if he worsens with the above plan.   Philis Fendt, MHS, PA-C [facility] 02/02/2018 4:10 PM

## 2018-02-03 LAB — HEMOGLOBIN A1C
ESTIMATED AVERAGE GLUCOSE: 100 mg/dL
HEMOGLOBIN A1C: 5.1 % (ref 4.8–5.6)

## 2018-02-14 NOTE — Addendum Note (Signed)
Addended by: Landis Gandy on: 02/14/2018 01:07 PM   Modules accepted: Orders

## 2018-11-22 ENCOUNTER — Other Ambulatory Visit: Payer: Self-pay | Admitting: Infectious Disease

## 2018-11-22 DIAGNOSIS — B20 Human immunodeficiency virus [HIV] disease: Secondary | ICD-10-CM

## 2018-11-24 ENCOUNTER — Other Ambulatory Visit: Payer: Self-pay

## 2018-11-24 DIAGNOSIS — B20 Human immunodeficiency virus [HIV] disease: Secondary | ICD-10-CM

## 2018-11-24 MED ORDER — BICTEGRAVIR-EMTRICITAB-TENOFOV 50-200-25 MG PO TABS: 1.0000 | ORAL_TABLET | Freq: Every day | ORAL | 0 refills | Status: DC

## 2018-11-28 ENCOUNTER — Other Ambulatory Visit: Payer: Self-pay

## 2018-11-28 DIAGNOSIS — B20 Human immunodeficiency virus [HIV] disease: Secondary | ICD-10-CM

## 2018-11-28 MED ORDER — BICTEGRAVIR-EMTRICITAB-TENOFOV 50-200-25 MG PO TABS: 1.0000 | ORAL_TABLET | Freq: Every day | ORAL | 4 refills | Status: DC

## 2019-03-09 ENCOUNTER — Other Ambulatory Visit: Payer: Self-pay | Admitting: Infectious Disease

## 2019-03-09 DIAGNOSIS — B20 Human immunodeficiency virus [HIV] disease: Secondary | ICD-10-CM

## 2019-03-09 NOTE — Telephone Encounter (Signed)
Needs office visit.

## 2019-04-27 ENCOUNTER — Other Ambulatory Visit: Payer: Self-pay | Admitting: Infectious Disease

## 2019-04-27 DIAGNOSIS — B20 Human immunodeficiency virus [HIV] disease: Secondary | ICD-10-CM

## 2019-04-27 NOTE — Telephone Encounter (Signed)
Needs office visit . Call the office .

## 2019-05-20 ENCOUNTER — Other Ambulatory Visit: Payer: Self-pay | Admitting: Infectious Disease

## 2019-05-20 DIAGNOSIS — B20 Human immunodeficiency virus [HIV] disease: Secondary | ICD-10-CM

## 2019-05-21 ENCOUNTER — Telehealth: Payer: Self-pay

## 2019-05-21 NOTE — Telephone Encounter (Signed)
Received refill request for patient's Biktarvy. Patient has not been seen in office since 12/2017. Refill request denied until patient is seen in office with labs two weeks before. Left voicemail requesting patient call office back for an appointment. Tommy Galvan

## 2019-05-22 ENCOUNTER — Other Ambulatory Visit: Payer: Self-pay | Admitting: *Deleted

## 2019-05-22 DIAGNOSIS — Z113 Encounter for screening for infections with a predominantly sexual mode of transmission: Secondary | ICD-10-CM

## 2019-05-22 DIAGNOSIS — B20 Human immunodeficiency virus [HIV] disease: Secondary | ICD-10-CM

## 2019-05-22 DIAGNOSIS — Z79899 Other long term (current) drug therapy: Secondary | ICD-10-CM

## 2019-05-23 ENCOUNTER — Other Ambulatory Visit: Payer: 59

## 2019-05-23 ENCOUNTER — Other Ambulatory Visit: Payer: Self-pay

## 2019-05-23 ENCOUNTER — Other Ambulatory Visit (HOSPITAL_COMMUNITY)
Admission: RE | Admit: 2019-05-23 | Discharge: 2019-05-23 | Disposition: A | Discharge status: Home / Self Care | Payer: 59 | Source: Ambulatory Visit | Attending: Infectious Disease | Admitting: Infectious Disease

## 2019-05-23 DIAGNOSIS — Z113 Encounter for screening for infections with a predominantly sexual mode of transmission: Secondary | ICD-10-CM | POA: Insufficient documentation

## 2019-05-23 DIAGNOSIS — B20 Human immunodeficiency virus [HIV] disease: Secondary | ICD-10-CM

## 2019-05-23 DIAGNOSIS — Z79899 Other long term (current) drug therapy: Secondary | ICD-10-CM

## 2019-05-24 LAB — URINE CYTOLOGY ANCILLARY ONLY
Chlamydia: NEGATIVE
Comment: NEGATIVE
Comment: NORMAL
Neisseria Gonorrhea: NEGATIVE

## 2019-05-24 LAB — T-HELPER CELL (CD4) - (RCID CLINIC ONLY)
CD4 % Helper T Cell: 22 % — ABNORMAL LOW (ref 33–65)
CD4 T Cell Abs: 343 /uL — ABNORMAL LOW (ref 400–1790)

## 2019-05-25 ENCOUNTER — Other Ambulatory Visit: Payer: Self-pay

## 2019-05-25 DIAGNOSIS — Z20822 Contact with and (suspected) exposure to covid-19: Secondary | ICD-10-CM

## 2019-05-26 ENCOUNTER — Emergency Department (HOSPITAL_COMMUNITY): Payer: Self-pay | Admitting: Urgent Care

## 2019-05-26 LAB — NOVEL CORONAVIRUS, NAA: SARS-CoV-2, NAA: NOT DETECTED

## 2019-05-27 LAB — LIPID PANEL
Cholesterol: 177 mg/dL (ref <200)
HDL: 43 mg/dL (ref >=40)
LDL Cholesterol (Calc): 112 mg/dL (calc) — ABNORMAL HIGH
Non-HDL Cholesterol (Calc): 134 mg/dL (calc) — ABNORMAL HIGH (ref <130)
Total CHOL/HDL Ratio: 4.1 (calc) (ref <5.0)
Triglycerides: 116 mg/dL (ref <150)

## 2019-05-27 LAB — COMPLETE METABOLIC PANEL WITH GFR
AG Ratio: 1.6 (calc) (ref 1.0–2.5)
ALT: 48 U/L — ABNORMAL HIGH (ref 9–46)
AST: 29 U/L (ref 10–40)
Albumin: 4.6 g/dL (ref 3.6–5.1)
Alkaline phosphatase (APISO): 65 U/L (ref 36–130)
BUN: 13 mg/dL (ref 7–25)
CO2: 30 mmol/L (ref 20–32)
Calcium: 9.9 mg/dL (ref 8.6–10.3)
Chloride: 102 mmol/L (ref 98–110)
Creat: 1.2 mg/dL (ref 0.60–1.35)
GFR, Est African American: 82 mL/min/{1.73_m2} (ref >=60)
GFR, Est Non African American: 71 mL/min/{1.73_m2} (ref >=60)
Globulin: 2.9 g/dL (calc) (ref 1.9–3.7)
Glucose, Bld: 106 mg/dL — ABNORMAL HIGH (ref 65–99)
Potassium: 4.7 mmol/L (ref 3.5–5.3)
Sodium: 139 mmol/L (ref 135–146)
Total Bilirubin: 0.7 mg/dL (ref 0.2–1.2)
Total Protein: 7.5 g/dL (ref 6.1–8.1)

## 2019-05-27 LAB — CBC WITH DIFFERENTIAL/PLATELET
Absolute Monocytes: 622 cells/uL (ref 200–950)
Basophils Absolute: 79 cells/uL (ref 0–200)
Basophils Relative: 1.3 %
Eosinophils Absolute: 128 cells/uL (ref 15–500)
Eosinophils Relative: 2.1 %
HCT: 47.2 % (ref 38.5–50.0)
Hemoglobin: 16.7 g/dL (ref 13.2–17.1)
Lymphs Abs: 1562 cells/uL (ref 850–3900)
MCH: 32.4 pg (ref 27.0–33.0)
MCHC: 35.4 g/dL (ref 32.0–36.0)
MCV: 91.7 fL (ref 80.0–100.0)
MPV: 10.3 fL (ref 7.5–12.5)
Monocytes Relative: 10.2 %
Neutro Abs: 3709 cells/uL (ref 1500–7800)
Neutrophils Relative %: 60.8 %
Platelets: 311 10*3/uL (ref 140–400)
RBC: 5.15 10*6/uL (ref 4.20–5.80)
RDW: 13 % (ref 11.0–15.0)
Total Lymphocyte: 25.6 %
WBC: 6.1 10*3/uL (ref 3.8–10.8)

## 2019-05-27 LAB — HIV-1 RNA QUANT-NO REFLEX-BLD
HIV 1 RNA Quant: <20 copies/mL
HIV-1 RNA Quant, Log: <1.3 Log copies/mL

## 2019-05-27 LAB — RPR: RPR Ser Ql: NONREACTIVE

## 2019-06-13 ENCOUNTER — Other Ambulatory Visit: Payer: Self-pay

## 2019-06-13 ENCOUNTER — Ambulatory Visit (INDEPENDENT_AMBULATORY_CARE_PROVIDER_SITE_OTHER): Payer: 59 | Admitting: Infectious Disease

## 2019-06-13 DIAGNOSIS — E669 Obesity, unspecified: Secondary | ICD-10-CM | POA: Diagnosis not present

## 2019-06-13 DIAGNOSIS — B20 Human immunodeficiency virus [HIV] disease: Secondary | ICD-10-CM | POA: Diagnosis not present

## 2019-06-13 MED ORDER — BIKTARVY 50-200-25 MG PO TABS: 1.0000 | ORAL_TABLET | Freq: Every day | ORAL | 4 refills | Status: DC

## 2019-06-13 NOTE — Progress Notes (Signed)
Virtual Visit via Telephone Note  I connected with Tommy Galvan on 06/13/19 at  9:15 AM EST by telephone and verified that I am speaking with the correct person using two identifiers.  Location: Patient: Home Provider: [facility]   I discussed the limitations, risks, security and privacy concerns of performing an evaluation and management service by telephone and the availability of in person appointments. I also discussed with the patient that there may be a patient responsible charge related to this service. The patient expressed understanding and agreed to proceed.  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 Time he was tried initially on Sustiva anTruvada t but was changed to Kaletra and Combivir I believe for better CNS penetration of the second regimen. In any case he responded well to that regimen and responded well to kaletra epzicom and then Prezista Norvir and epzicom and has had undetectable viral loads for years.  We changed him last to  2 tivicay and epzico and  then changed to [place]  He is remained virologically suppressed since then.  He has had no problems with depression recently.  He is overweight but does not have other risk factors for coronavirus 2019 other than age and male gender.  He was wondering if there are other things he could do to help prevent COVID-19 and whether his antiviral would confer any of his protection.  We discussed the SOLAR switch study and he might very well be a candidate as WAS interested.  I do not see any history of urological failure looking through the chart.  I do not know if the number of regimens he is been on could be an obstacle or not but hopefully it is not.       Observations/Objective: HIV that is quite well controlled on Biktarvy.   Patient is interested in the SOLAR study  Assessment and Plan:  HIV disease: Will give his information to Herschell Dimes who will be reaching out to the patient if he is eligible to be on the study.  If he is not eligible for the study we will simply continue him on Biktarvy and see him in 1 years time.  He had his flu shot at [facility] in October.  Obesity: Have asked him to try to lose weight through carbohydrate restriction both for long-term benefits and also as a intervention in the context of coronavirus 2019 Follow Up Instructions:    I discussed the assessment and treatment plan with the patient. The patient was provided an opportunity to ask questions and all were answered. The patient agreed with the plan and demonstrated an understanding of the instructions.   The patient was advised to call back or seek an in-person evaluation if the symptoms worsen or if the condition fails to improve as anticipated.  I provided 21 minutes of non-face-to-face time during this encounter.   Alcide Evener, MD

## 2019-08-02 ENCOUNTER — Other Ambulatory Visit: Payer: Self-pay

## 2019-08-02 ENCOUNTER — Telehealth: Payer: Self-pay

## 2019-08-02 DIAGNOSIS — B20 Human immunodeficiency virus [HIV] disease: Secondary | ICD-10-CM

## 2019-08-02 MED ORDER — BIKTARVY 50-200-25 MG PO TABS: 1.0000 | ORAL_TABLET | Freq: Every day | ORAL | 2 refills | Status: DC

## 2019-08-02 NOTE — Telephone Encounter (Signed)
Received fax from pharmacy requesting updated insurance information. Call placed to patient and provided patient with contact information to update insurance information. Tommy Galvan

## 2019-08-02 NOTE — Telephone Encounter (Signed)
Patient no longer using [facility]. Would like Rx sent to [facility]. Pharmacy updated. And new Rx for Biktarvy sent with 2 refills.  Tommy Galvan

## 2019-11-06 ENCOUNTER — Ambulatory Visit (INDEPENDENT_AMBULATORY_CARE_PROVIDER_SITE_OTHER): Payer: BC Managed Care – PPO | Admitting: Ophthalmology

## 2019-11-06 ENCOUNTER — Other Ambulatory Visit: Payer: Self-pay

## 2019-11-06 ENCOUNTER — Encounter (INDEPENDENT_AMBULATORY_CARE_PROVIDER_SITE_OTHER): Payer: Self-pay | Admitting: Ophthalmology

## 2019-11-06 DIAGNOSIS — H43812 Vitreous degeneration, left eye: Secondary | ICD-10-CM | POA: Diagnosis not present

## 2019-11-06 DIAGNOSIS — H2513 Age-related nuclear cataract, bilateral: Secondary | ICD-10-CM | POA: Insufficient documentation

## 2019-11-06 DIAGNOSIS — H35341 Macular cyst, hole, or pseudohole, right eye: Secondary | ICD-10-CM | POA: Diagnosis not present

## 2019-11-06 DIAGNOSIS — H43811 Vitreous degeneration, right eye: Secondary | ICD-10-CM

## 2019-11-06 DIAGNOSIS — H43822 Vitreomacular adhesion, left eye: Secondary | ICD-10-CM | POA: Diagnosis not present

## 2019-11-06 HISTORY — DX: Age-related nuclear cataract, bilateral: H25.13

## 2019-11-06 MED ORDER — OFLOXACIN 0.3 % OP SOLN: 1.0000 [drp] | Freq: Four times a day (QID) | OPHTHALMIC | 0 refills | Status: AC

## 2019-11-06 MED ORDER — PREDNISOLONE ACETATE 1 % OP SUSP: 1.0000 [drp] | Freq: Four times a day (QID) | OPHTHALMIC | 0 refills | Status: DC

## 2019-11-06 NOTE — Patient Instructions (Addendum)
Preoperative instructions and risk and benefits of the procedure were reviewed in detail with Tommy Galvan.  We have offered surgical intervention as of November 14, 2019 with vitrectomy, membrane peel of the right eye, gas injection   Macular Hole  The macula is the part of the eye that allows you to see clearly and sharply. The macula also allows you to see detail. It is located inside the eyeball, in the center of the eye's light-sensitive tissue (retina). A hole in the macula affects how well you can see. Usually, only one eye has a macular hole. However, it is possible for both eyes to have a macular hole. What are the causes? The cause may not be known (idiopathic). Possible causes may include:  The macula breaking down as part of the natural aging process.  Eye injury.  Extreme nearsightedness (high myopia). What increases the risk? You are more likely to develop this condition if you:  Are male.  Are older. The risk increases with age.  Have a macular disease that is passed from parent to child (inherited), such as Best disease. What are the signs or symptoms? Symptoms may include:  Blurry vision.  Vision that makes things look misshapen (distorted).  Difficulty doing things that require you to see details, like reading and driving. How is this diagnosed? This condition may be diagnosed based on:  Your medical history.  An eye exam. During the exam: ? An eye specialist (ophthalmologist) may put drops in your eye to make your pupils bigger (dilation). This helps the specialist see your macula. ? You may be asked to look at a series of crossed lines that form a checkerboard pattern (Amsler grid).  A test that gives your health care provider a cross-sectional view of your retina (optical coherence tomogram).  A test that checks for fluid collection in the area of the hole (fluorescein angiogram). How is this treated? Some macular holes may seal themselves and go away  without treatment. Other holes may last a long time and even become permanent. Surgery to fix the macular hole (vitrectomy) is often needed. Follow these instructions at home:   Do not drive unless your health care provider approves.  Ask your health care provider what activities are safe for you.  Keep all follow-up visits as told by your health care provider. This is important. Contact a health care provider if:  You have new or worsening symptoms. Summary  The macula is the part of the eye that allows you to see clearly and sharply. A hole in the macula affects how well you can see.  Surgery to fix the macular hole (vitrectomy) is often needed.  Keep all follow-up visits as told by your health care provider. This is important. This information is not intended to replace advice given to you by your health care provider. Make sure you discuss any questions you have with your health care provider. Document Revised: 07/18/2017 Document Reviewed: 07/18/2017 Elsevier Patient Education  2020 [employer].

## 2019-11-06 NOTE — Assessment & Plan Note (Signed)
Pre-operative instructions completed. All questions answered. Pre-op drops sent to pharmacy.

## 2019-11-06 NOTE — Progress Notes (Signed)
11/06/2019     CHIEF COMPLAINT Patient presents for Retina Evaluation   HISTORY OF PRESENT ILLNESS: Tommy Galvan is a 49 y.o. male who presents to the clinic today for:   HPI    Retina Evaluation    In right eye.  This started 3 days ago.  Associated Symptoms Floaters.  Negative for Flashes.  Context:  distance vision, near vision and mid-range vision.  Treatments tried include no treatments.          Comments    Referral from [facility] for macular hole OD. Patient states on Sunday he started to get blurry vision in his right eye. Patient states he has noticed that parts of words are missing when he is reading.       Last edited by Gerda Diss on 11/06/2019  4:09 PM. (History)      Referring physician: Rutherford Guys, MD [address] [place],  [place] [address]  HISTORICAL INFORMATION:   Selected notes from the MEDICAL RECORD NUMBER    Lab Results  Component Value Date   HGBA1C 5.1 02/02/2018     CURRENT MEDICATIONS: No current outpatient medications on file. (Ophthalmic Drugs)   No current facility-administered medications for this visit. (Ophthalmic Drugs)   Current Outpatient Medications (Other)  Medication Sig  . bictegravir-emtricitabine-tenofovir AF (BIKTARVY) 50-200-25 MG TABS tablet Take 1 tablet by mouth daily.   No current facility-administered medications for this visit. (Other)      REVIEW OF SYSTEMS:    ALLERGIES Allergies  Allergen Reactions  . Penicillins     REACTION: Reaction unknown; remote    PAST MEDICAL HISTORY Past Medical History:  Diagnosis Date  . Depression   . HIV disease ([place]) 10/06/2015  . 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 HIV infection ([place])   . Nuclear sclerotic cataract of both eyes 11/06/2019   Past Surgical History:  Procedure Laterality Date  . APPENDECTOMY      FAMILY HISTORY Family History  Problem Relation Age of Onset  . Cancer Father   . Hyperlipidemia Father   . Mental illness Brother   .  Diabetes Maternal Grandmother   . Hyperlipidemia Maternal Grandfather   . Hypertension Maternal Grandfather   . Stroke Maternal Grandfather   . Cancer Maternal Grandfather   . Heart disease Paternal Grandmother   . Hyperlipidemia Paternal Grandmother   . Hypertension Paternal Grandmother   . Stroke Paternal Grandmother   . Cancer Paternal Grandfather     SOCIAL HISTORY Social History   Tobacco Use  . Smoking status: Never Smoker  . Smokeless tobacco: Never Used  Substance Use Topics  . Alcohol use: No  . Drug use: No         OPHTHALMIC EXAM: Base Eye Exam    Visual Acuity (Snellen - Linear)      Right Left   Dist cc 20/70+2 20/20-2   Dist ph cc NI    Correction: Glasses       Tonometry (Tonopen, 4:14 PM)      Right Left   Pressure 12 13       Pupils      Pupils Dark Light Shape React APD   Right PERRL 5 3 Round Brisk None   Left PERRL 5 3 Round Brisk None       Visual Fields (Counting fingers)      Left Right    Full Full       Extraocular Movement      Right Left  Full Full       Neuro/Psych    Oriented x3: Yes   Mood/Affect: Normal       Dilation    Both eyes: 1.0% Mydriacyl, 2.5% Phenylephrine @ 4:15 PM        Slit Lamp and Fundus Exam    External Exam      Right Left   External Normal Normal       Slit Lamp Exam      Right Left   Lids/Lashes Normal Normal   Conjunctiva/Sclera White and quiet White and quiet   Cornea Clear Clear   Anterior Chamber Deep and quiet Deep and quiet   Iris Round and reactive Round and reactive   Lens 1+ Nuclear sclerosis 1+ Nuclear sclerosis   Anterior Vitreous Normal Normal       Fundus Exam      Right Left   Posterior Vitreous ,,, Posterior vitreous detachment Normal   Disc Normal Normal   C/D Ratio 0.2 0.1   Macula Macular hole.. stage 4 Normal   Vessels Normal Normal   Periphery Normal Normal          IMAGING AND PROCEDURES  Imaging and Procedures for 11/06/19             ASSESSMENT/PLAN:  No problem-specific Assessment & Plan notes found for this encounter.      ICD-10-CM   1. 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 Nuclear sclerotic cataract of both eyes  H25.13   3. Posterior vitreous detachment of left eye  H43.812   4. 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 Posterior vitreous detachment of right eye  H43.811     1.  2.  3.  Ophthalmic Meds Ordered this visit:  No orders of the defined types were placed in this encounter.      Return SCHEDULE VIT ILM PEEL WITH GAS, OD SCA, LMAC.  There are no Patient Instructions on file for this visit.   Explained the diagnoses, plan, and follow up with the patient and they expressed understanding.  Patient expressed understanding of the importance of proper follow up care.   Clent Demark Amos Gaber M.D. Diseases & Surgery of the Retina and Vitreous Retina & Diabetic [place] 11/06/19     Abbreviations: M myopia (nearsighted); A astigmatism; H hyperopia (farsighted); P presbyopia; Mrx spectacle prescription;  CTL contact lenses; OD right eye; OS left eye; OU both eyes  XT exotropia; ET esotropia; PEK punctate epithelial keratitis; PEE punctate epithelial erosions; DES dry eye syndrome; MGD meibomian gland dysfunction; ATs artificial tears; PFAT's preservative free artificial tears; [place] nuclear sclerotic cataract; PSC posterior subcapsular cataract; ERM epi-retinal membrane; PVD posterior vitreous detachment; RD retinal detachment; DM diabetes mellitus; DR diabetic retinopathy; NPDR non-proliferative diabetic retinopathy; PDR proliferative diabetic retinopathy; CSME clinically significant macular edema; DME diabetic macular edema; dbh dot blot hemorrhages; CWS cotton wool spot; POAG primary open angle glaucoma; C/D cup-to-disc ratio; HVF humphrey visual field; GVF goldmann visual field; OCT optical coherence tomography; IOP intraocular pressure; BRVO Branch retinal vein occlusion; CRVO central retinal  vein occlusion; CRAO central retinal artery occlusion; BRAO branch retinal artery occlusion; RT retinal tear; SB scleral buckle; PPV pars plana vitrectomy; VH Vitreous hemorrhage; PRP panretinal laser photocoagulation; IVK intravitreal kenalog; VMT vitreomacular traction; MH Macular hole;  NVD neovascularization of the disc; NVE neovascularization elsewhere; AREDS age related eye disease study; ARMD age related macular degeneration; POAG primary open angle glaucoma; EBMD epithelial/anterior basement membrane dystrophy;  ACIOL anterior chamber intraocular lens; IOL intraocular lens; PCIOL posterior chamber intraocular lens; Phaco/IOL phacoemulsification with intraocular lens placement; [place] photorefractive keratectomy; LASIK laser assisted in situ keratomileusis; HTN hypertension; DM diabetes mellitus; COPD chronic obstructive pulmonary disease

## 2019-11-14 ENCOUNTER — Encounter (INDEPENDENT_AMBULATORY_CARE_PROVIDER_SITE_OTHER): Payer: BC Managed Care – PPO | Admitting: Ophthalmology

## 2019-11-14 DIAGNOSIS — H35341 Macular cyst, hole, or pseudohole, right eye: Secondary | ICD-10-CM | POA: Diagnosis not present

## 2019-11-15 ENCOUNTER — Other Ambulatory Visit: Payer: Self-pay

## 2019-11-15 ENCOUNTER — Encounter (INDEPENDENT_AMBULATORY_CARE_PROVIDER_SITE_OTHER): Payer: Self-pay | Admitting: Ophthalmology

## 2019-11-15 ENCOUNTER — Ambulatory Visit (INDEPENDENT_AMBULATORY_CARE_PROVIDER_SITE_OTHER): Payer: BC Managed Care – PPO | Admitting: Ophthalmology

## 2019-11-15 DIAGNOSIS — Z09 Encounter for follow-up examination after completed treatment for conditions other than malignant neoplasm: Secondary | ICD-10-CM | POA: Insufficient documentation

## 2019-11-15 DIAGNOSIS — H35341 Macular cyst, hole, or pseudohole, right eye: Secondary | ICD-10-CM

## 2019-11-15 NOTE — Progress Notes (Signed)
11/15/2019     CHIEF COMPLAINT Patient presents for Post-op Follow-up   HISTORY OF PRESENT ILLNESS: Tommy Galvan is a 49 y.o. male who presents to the clinic today for:   HPI    Post-op Follow-up    In right eye.  Discomfort includes none.  Vision is stable.  I, the attending physician,  performed the HPI with the patient and updated documentation appropriately.          Comments    1 Day s\p VIT ILM PEEL WITH GAS OD  Pt states he slept well last night. Denies pain and discomfort. Pt has been doing head down positioning.       Last edited by [name] on 11/15/2019  8:03 AM. (History)      Referring physician: Rutherford Guys, MD [address] [place],  [place] [address]  HISTORICAL INFORMATION:   Selected notes from the MEDICAL RECORD NUMBER    Lab Results  Component Value Date   HGBA1C 5.1 02/02/2018     CURRENT MEDICATIONS: Current Outpatient Medications (Ophthalmic Drugs)  Medication Sig  . ofloxacin (OCUFLOX) 0.3 % ophthalmic solution Place 1 drop into the right eye 4 (four) times daily for 21 days.  . prednisoLONE acetate (PRED FORTE) 1 % ophthalmic suspension Place 1 drop into the right eye 4 (four) times daily.   No current facility-administered medications for this visit. (Ophthalmic Drugs)   Current Outpatient Medications (Other)  Medication Sig  . bictegravir-emtricitabine-tenofovir AF (BIKTARVY) 50-200-25 MG TABS tablet Take 1 tablet by mouth daily.   No current facility-administered medications for this visit. (Other)      REVIEW OF SYSTEMS:    ALLERGIES Allergies  Allergen Reactions  . Penicillins     REACTION: Reaction unknown; remote    PAST MEDICAL HISTORY Past Medical History:  Diagnosis Date  . Depression   . HIV disease ([place]) 10/06/2015  . 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 HIV infection ([place])   . Nuclear sclerotic cataract of both eyes 11/06/2019   Past Surgical History:  Procedure Laterality Date  . APPENDECTOMY       FAMILY HISTORY Family History  Problem Relation Age of Onset  . Cancer Father   . Hyperlipidemia Father   . Mental illness Brother   . Diabetes Maternal Grandmother   . Hyperlipidemia Maternal Grandfather   . Hypertension Maternal Grandfather   . Stroke Maternal Grandfather   . Cancer Maternal Grandfather   . Heart disease Paternal Grandmother   . Hyperlipidemia Paternal Grandmother   . Hypertension Paternal Grandmother   . Stroke Paternal Grandmother   . Cancer Paternal Grandfather     SOCIAL HISTORY Social History   Tobacco Use  . Smoking status: Never Smoker  . Smokeless tobacco: Never Used  Substance Use Topics  . Alcohol use: No  . Drug use: No         OPHTHALMIC EXAM: Base Eye Exam    Visual Acuity (Snellen - Linear)      Right Left   Dist Melissa CF 2' 20/400       Tonometry (Tonopen, 8:04 AM)      Right Left   Pressure 14 15       Pupils      Pupils Dark Light Shape React APD   Right PERRL 7 7 Round Dilated None   Left PERRL 4 3 Round Brisk None       Neuro/Psych    Oriented x3: Yes   Mood/Affect: Normal  Slit Lamp and Fundus Exam    External Exam      Right Left   External Normal Normal       Slit Lamp Exam      Right Left   Lids/Lashes Normal Normal   Conjunctiva/Sclera White and quiet White and quiet   Cornea Clear Clear   Anterior Chamber Deep and quiet Deep and quiet   Iris Round and reactive Round and reactive   Lens 1+ Nuclear sclerosis 1+ Nuclear sclerosis   Anterior Vitreous Normal Normal       Fundus Exam      Right Left   Posterior Vitreous ,,, Posterior vitreous detachment    Disc Normal    C/D Ratio 0.2    Macula Macular hole.. stage 4    Vessels Normal    Periphery Normal           IMAGING AND PROCEDURES  Imaging and Procedures for 11/15/19           ASSESSMENT/PLAN:  No problem-specific Assessment & Plan notes found for this encounter.    No diagnosis  found.  1.  2.  3.  Ophthalmic Meds Ordered this visit:  No orders of the defined types were placed in this encounter.      No follow-ups on file.  There are no Patient Instructions on file for this visit.   Explained the diagnoses, plan, and follow up with the patient and they expressed understanding.  Patient expressed understanding of the importance of proper follow up care.   Clent Demark Reinhardt Licausi M.D. Diseases & Surgery of the Retina and Vitreous Retina & Diabetic [place] 11/15/19     Abbreviations: M myopia (nearsighted); A astigmatism; H hyperopia (farsighted); P presbyopia; Mrx spectacle prescription;  CTL contact lenses; OD right eye; OS left eye; OU both eyes  XT exotropia; ET esotropia; PEK punctate epithelial keratitis; PEE punctate epithelial erosions; DES dry eye syndrome; MGD meibomian gland dysfunction; ATs artificial tears; PFAT's preservative free artificial tears; [place] nuclear sclerotic cataract; PSC posterior subcapsular cataract; ERM epi-retinal membrane; PVD posterior vitreous detachment; RD retinal detachment; DM diabetes mellitus; DR diabetic retinopathy; NPDR non-proliferative diabetic retinopathy; PDR proliferative diabetic retinopathy; CSME clinically significant macular edema; DME diabetic macular edema; dbh dot blot hemorrhages; CWS cotton wool spot; POAG primary open angle glaucoma; C/D cup-to-disc ratio; HVF humphrey visual field; GVF goldmann visual field; OCT optical coherence tomography; IOP intraocular pressure; BRVO Branch retinal vein occlusion; CRVO central retinal vein occlusion; CRAO central retinal artery occlusion; BRAO branch retinal artery occlusion; RT retinal tear; SB scleral buckle; PPV pars plana vitrectomy; VH Vitreous hemorrhage; PRP panretinal laser photocoagulation; IVK intravitreal kenalog; VMT vitreomacular traction; MH Macular hole;  NVD neovascularization of the disc; NVE neovascularization elsewhere; AREDS age related eye disease  study; ARMD age related macular degeneration; POAG primary open angle glaucoma; EBMD epithelial/anterior basement membrane dystrophy; ACIOL anterior chamber intraocular lens; IOL intraocular lens; PCIOL posterior chamber intraocular lens; Phaco/IOL phacoemulsification with intraocular lens placement; [place] photorefractive keratectomy; LASIK laser assisted in situ keratomileusis; HTN hypertension; DM diabetes mellitus; COPD chronic obstructive pulmonary disease

## 2019-11-22 ENCOUNTER — Other Ambulatory Visit: Payer: Self-pay

## 2019-11-22 ENCOUNTER — Ambulatory Visit (INDEPENDENT_AMBULATORY_CARE_PROVIDER_SITE_OTHER): Payer: BC Managed Care – PPO | Admitting: Ophthalmology

## 2019-11-22 ENCOUNTER — Encounter (INDEPENDENT_AMBULATORY_CARE_PROVIDER_SITE_OTHER): Payer: Self-pay | Admitting: Ophthalmology

## 2019-11-22 DIAGNOSIS — H35341 Macular cyst, hole, or pseudohole, right eye: Secondary | ICD-10-CM

## 2019-11-22 DIAGNOSIS — Z09 Encounter for follow-up examination after completed treatment for conditions other than malignant neoplasm: Secondary | ICD-10-CM

## 2019-11-22 NOTE — Progress Notes (Signed)
11/22/2019     CHIEF COMPLAINT Patient presents for Post-op Follow-up   HISTORY OF PRESENT ILLNESS: Tommy Galvan is a 49 y.o. male who presents to the clinic today for:   HPI    Post-op Follow-up    In right eye.  Discomfort includes none.  Vision is stable.  I, the attending physician,  performed the HPI with the patient and updated documentation appropriately.          Comments    1 Week s\p vit peel with gas inj OD  Pt states vision is stable. States it is blurry looking through the bubble. Denies pain. Using gtts as directed. Pt is still doing reading position.       Last edited by [name] on 11/22/2019  9:53 AM. (History)      Referring physician: Rutherford Guys, MD [address] [place],  [place] [address]  HISTORICAL INFORMATION:   Selected notes from the MEDICAL RECORD NUMBER    Lab Results  Component Value Date   HGBA1C 5.1 02/02/2018     CURRENT MEDICATIONS: Current Outpatient Medications (Ophthalmic Drugs)  Medication Sig  . ofloxacin (OCUFLOX) 0.3 % ophthalmic solution Place 1 drop into the right eye 4 (four) times daily for 21 days.  . prednisoLONE acetate (PRED FORTE) 1 % ophthalmic suspension Place 1 drop into the right eye 4 (four) times daily.   No current facility-administered medications for this visit. (Ophthalmic Drugs)   Current Outpatient Medications (Other)  Medication Sig  . bictegravir-emtricitabine-tenofovir AF (BIKTARVY) 50-200-25 MG TABS tablet Take 1 tablet by mouth daily.   No current facility-administered medications for this visit. (Other)      REVIEW OF SYSTEMS:    ALLERGIES Allergies  Allergen Reactions  . Penicillins     REACTION: Reaction unknown; remote    PAST MEDICAL HISTORY Past Medical History:  Diagnosis Date  . Depression   . 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 HIV infection ([place])   . Nuclear sclerotic cataract of both eyes 11/06/2019   Past Surgical History:    Procedure Laterality Date  . APPENDECTOMY      FAMILY HISTORY Family History  Problem Relation Age of Onset  . Cancer Father   . Hyperlipidemia Father   . Mental illness Brother   . Diabetes Maternal Grandmother   . Hyperlipidemia Maternal Grandfather   . Hypertension Maternal Grandfather   . Stroke Maternal Grandfather   . Cancer Maternal Grandfather   . Heart disease Paternal Grandmother   . Hyperlipidemia Paternal Grandmother   . Hypertension Paternal Grandmother   . Stroke Paternal Grandmother   . Cancer Paternal Grandfather     SOCIAL HISTORY Social History   Tobacco Use  . Smoking status: Never Smoker  . Smokeless tobacco: Never Used  Substance Use Topics  . Alcohol use: No  . Drug use: No         OPHTHALMIC EXAM: Base Eye Exam    Visual Acuity (Snellen - Linear)      Right Left   Dist Elias-Fela Solis  20/20 -1   Dist cc 20/50 -2    Dist ph cc 20/50 +        Tonometry (Tonopen, 9:58 AM)      Right Left   Pressure 14 16       Pupils      Pupils Dark Light Shape React APD   Right PERRL 4 3 Round Brisk None   Left PERRL  4 3 Round Brisk None       Visual Fields (Counting fingers)      Left Right    Full Full       Neuro/Psych    Oriented x3: Yes   Mood/Affect: Normal       Dilation    Right eye: 1.0% Mydriacyl, 2.5% Phenylephrine @ 9:58 AM        Slit Lamp and Fundus Exam    External Exam      Right Left   External Normal Normal       Slit Lamp Exam      Right Left   Lids/Lashes Normal Normal   Conjunctiva/Sclera White and quiet White and quiet   Cornea Clear Clear   Anterior Chamber Deep and quiet Deep and quiet   Iris Round and reactive Round and reactive   Lens 1+ Nuclear sclerosis 1+ Nuclear sclerosis   Anterior Vitreous Normal Normal       Fundus Exam      Right Left   Posterior Vitreous Gas, 25%    Disc Normal    C/D Ratio 0.3    Macula Macular hole closed    Vessels Normal    Periphery Normal           IMAGING AND  PROCEDURES  Imaging and Procedures for 11/22/19  OCT, Retina - OU - Both Eyes       Right Eye Quality was good. Central Foveal Thickness: 311. Progression has improved.   Left Eye Quality was good. Scan locations included subfoveal. Central Foveal Thickness: 276.   Notes Macular hole closed OD 1 week post vitrectomy membrane peel                ASSESSMENT/PLAN:  No problem-specific Assessment & Plan notes found for this encounter.      ICD-10-CM   1. 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 Follow-up examination after eye surgery  Z09 OCT, Retina - OU - Both Eyes    1.  Postoperative repair for macular hole right eye.  Preoperative vision of 20/70 has improved 1 week postop now to 20/50 +2.  2.  I explained to the patient the gas bubbles likely to clear by 1 week or less.  He should avoid supine position for the next 1 week.  3.  Patient is to continue his topical eyedrops for 2 more weeks and then completely stop all medications that remain.  Do not refill the current bottles.  Patient reports having returned to work 2 days postop, I have no disagreement with that.   Ophthalmic Meds Ordered this visit:  No orders of the defined types were placed in this encounter.      No follow-ups on file.  There are no Patient Instructions on file for this visit.   Explained the diagnoses, plan, and follow up with the patient and they expressed understanding.  Patient expressed understanding of the importance of proper follow up care.   Clent Demark Rylee Nuzum M.D. Diseases & Surgery of the Retina and Vitreous Retina & Diabetic [place] 11/22/19     Abbreviations: M myopia (nearsighted); A astigmatism; H hyperopia (farsighted); P presbyopia; Mrx spectacle prescription;  CTL contact lenses; OD right eye; OS left eye; OU both eyes  XT exotropia; ET esotropia; PEK punctate epithelial keratitis; PEE punctate epithelial erosions; DES dry eye  syndrome; MGD meibomian gland dysfunction; ATs artificial tears; PFAT's preservative free artificial tears; [place]  nuclear sclerotic cataract; PSC posterior subcapsular cataract; ERM epi-retinal membrane; PVD posterior vitreous detachment; RD retinal detachment; DM diabetes mellitus; DR diabetic retinopathy; NPDR non-proliferative diabetic retinopathy; PDR proliferative diabetic retinopathy; CSME clinically significant macular edema; DME diabetic macular edema; dbh dot blot hemorrhages; CWS cotton wool spot; POAG primary open angle glaucoma; C/D cup-to-disc ratio; HVF humphrey visual field; GVF goldmann visual field; OCT optical coherence tomography; IOP intraocular pressure; BRVO Branch retinal vein occlusion; CRVO central retinal vein occlusion; CRAO central retinal artery occlusion; BRAO branch retinal artery occlusion; RT retinal tear; SB scleral buckle; PPV pars plana vitrectomy; VH Vitreous hemorrhage; PRP panretinal laser photocoagulation; IVK intravitreal kenalog; VMT vitreomacular traction; MH Macular hole;  NVD neovascularization of the disc; NVE neovascularization elsewhere; AREDS age related eye disease study; ARMD age related macular degeneration; POAG primary open angle glaucoma; EBMD epithelial/anterior basement membrane dystrophy; ACIOL anterior chamber intraocular lens; IOL intraocular lens; PCIOL posterior chamber intraocular lens; Phaco/IOL phacoemulsification with intraocular lens placement; [place] photorefractive keratectomy; LASIK laser assisted in situ keratomileusis; HTN hypertension; DM diabetes mellitus; COPD chronic obstructive pulmonary disease

## 2020-01-17 ENCOUNTER — Encounter (INDEPENDENT_AMBULATORY_CARE_PROVIDER_SITE_OTHER): Payer: Self-pay | Admitting: Ophthalmology

## 2020-01-17 ENCOUNTER — Ambulatory Visit (INDEPENDENT_AMBULATORY_CARE_PROVIDER_SITE_OTHER): Payer: BC Managed Care – PPO | Admitting: Ophthalmology

## 2020-01-17 ENCOUNTER — Other Ambulatory Visit: Payer: Self-pay

## 2020-01-17 DIAGNOSIS — H2513 Age-related nuclear cataract, bilateral: Secondary | ICD-10-CM

## 2020-01-17 DIAGNOSIS — H35341 Macular cyst, hole, or pseudohole, right eye: Secondary | ICD-10-CM | POA: Diagnosis not present

## 2020-01-17 NOTE — Progress Notes (Signed)
01/17/2020     CHIEF COMPLAINT Patient presents for Post-op Follow-up   HISTORY OF PRESENT ILLNESS: Tommy Galvan is a 49 y.o. male who presents to the clinic today for:   HPI    Post-op Follow-up    In right eye.  Discomfort includes Negative for pain.  Vision is stable.  I, the attending physician,  performed the HPI with the patient and updated documentation appropriately.          Comments    8 Week Post Op OD. OCT  Pt states vision is stable and has slightly improved. Denies pain.       Last edited by [name] on 01/17/2020  8:01 AM. (History)      Referring physician: Rutherford Guys, MD [address] [place],  [place] [address]  HISTORICAL INFORMATION:   Selected notes from the MEDICAL RECORD NUMBER    Lab Results  Component Value Date   HGBA1C 5.1 02/02/2018     CURRENT MEDICATIONS: Current Outpatient Medications (Ophthalmic Drugs)  Medication Sig  . prednisoLONE acetate (PRED FORTE) 1 % ophthalmic suspension Place 1 drop into the right eye 4 (four) times daily. (Patient not taking: Reported on 01/17/2020)   No current facility-administered medications for this visit. (Ophthalmic Drugs)   Current Outpatient Medications (Other)  Medication Sig  . bictegravir-emtricitabine-tenofovir AF (BIKTARVY) 50-200-25 MG TABS tablet Take 1 tablet by mouth daily.   No current facility-administered medications for this visit. (Other)      REVIEW OF SYSTEMS:    ALLERGIES Allergies  Allergen Reactions  . Penicillins     REACTION: Reaction unknown; remote    PAST MEDICAL HISTORY Past Medical History:  Diagnosis Date  . Depression   . HIV disease ([facility]) 10/06/2015  . 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 HIV infection ([facility])   . Nuclear sclerotic cataract of both eyes 11/06/2019   Past Surgical History:  Procedure Laterality Date  . APPENDECTOMY      FAMILY HISTORY Family History  Problem Relation Age of Onset  . Cancer Father   . Hyperlipidemia  Father   . Mental illness Brother   . Diabetes Maternal Grandmother   . Hyperlipidemia Maternal Grandfather   . Hypertension Maternal Grandfather   . Stroke Maternal Grandfather   . Cancer Maternal Grandfather   . Heart disease Paternal Grandmother   . Hyperlipidemia Paternal Grandmother   . Hypertension Paternal Grandmother   . Stroke Paternal Grandmother   . Cancer Paternal Grandfather     SOCIAL HISTORY Social History   Tobacco Use  . Smoking status: Never Smoker  . Smokeless tobacco: Never Used  Substance Use Topics  . Alcohol use: No  . Drug use: No         OPHTHALMIC EXAM: Base Eye Exam    Visual Acuity (Snellen - Linear)      Right Left   Dist cc 20/30 -2 20/25 +   Dist ph cc 20/30        Tonometry (Tonopen, 8:05 AM)      Right Left   Pressure 13 13       Pupils      Dark Light Shape React APD   Right 4 3 Round Brisk None   Left 4 3 Round Brisk None       Visual Fields (Counting fingers)      Left Right    Full Full       Neuro/Psych    Oriented x3: Yes  Mood/Affect: Normal       Dilation    Right eye: 1.0% Mydriacyl, 2.5% Phenylephrine @ 8:05 AM        Slit Lamp and Fundus Exam    External Exam      Right Left   External Normal Normal       Slit Lamp Exam      Right Left   Lids/Lashes Normal Normal   Conjunctiva/Sclera White and quiet White and quiet   Cornea Clear Clear   Anterior Chamber Deep and quiet Deep and quiet   Iris Round and reactive Round and reactive   Lens 1+ Nuclear sclerosis 1+ Nuclear sclerosis   Anterior Vitreous Normal Normal       Fundus Exam      Right Left   Posterior Vitreous Vitrectomized clear    Disc Normal    C/D Ratio 0.3    Macula Macular hole closed    Vessels Normal    Periphery Normal           IMAGING AND PROCEDURES  Imaging and Procedures for 01/17/20           ASSESSMENT/PLAN:  No problem-specific Assessment & Plan notes found for this encounter.      ICD-10-CM   1.  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  2.  3.  Ophthalmic Meds Ordered this visit:  No orders of the defined types were placed in this encounter.      No follow-ups on file.  There are no Patient Instructions on file for this visit.   Explained the diagnoses, plan, and follow up with the patient and they expressed understanding.  Patient expressed understanding of the importance of proper follow up care.   Clent Demark Camil Hausmann M.D. Diseases & Surgery of the Retina and Vitreous Retina & Diabetic [place] 01/17/20     Abbreviations: M myopia (nearsighted); A astigmatism; H hyperopia (farsighted); P presbyopia; Mrx spectacle prescription;  CTL contact lenses; OD right eye; OS left eye; OU both eyes  XT exotropia; ET esotropia; PEK punctate epithelial keratitis; PEE punctate epithelial erosions; DES dry eye syndrome; MGD meibomian gland dysfunction; ATs artificial tears; PFAT's preservative free artificial tears; [place] nuclear sclerotic cataract; PSC posterior subcapsular cataract; ERM epi-retinal membrane; PVD posterior vitreous detachment; RD retinal detachment; DM diabetes mellitus; DR diabetic retinopathy; NPDR non-proliferative diabetic retinopathy; PDR proliferative diabetic retinopathy; CSME clinically significant macular edema; DME diabetic macular edema; dbh dot blot hemorrhages; CWS cotton wool spot; POAG primary open angle glaucoma; C/D cup-to-disc ratio; HVF humphrey visual field; GVF goldmann visual field; OCT optical coherence tomography; IOP intraocular pressure; BRVO Branch retinal vein occlusion; CRVO central retinal vein occlusion; CRAO central retinal artery occlusion; BRAO branch retinal artery occlusion; RT retinal tear; SB scleral buckle; PPV pars plana vitrectomy; VH Vitreous hemorrhage; PRP panretinal laser photocoagulation; IVK intravitreal kenalog; VMT vitreomacular traction; MH Macular hole;  NVD neovascularization of the disc; NVE  neovascularization elsewhere; AREDS age related eye disease study; ARMD age related macular degeneration; POAG primary open angle glaucoma; EBMD epithelial/anterior basement membrane dystrophy; ACIOL anterior chamber intraocular lens; IOL intraocular lens; PCIOL posterior chamber intraocular lens; Phaco/IOL phacoemulsification with intraocular lens placement; [place] photorefractive keratectomy; LASIK laser assisted in situ keratomileusis; HTN hypertension; DM diabetes mellitus; COPD chronic obstructive pulmonary disease

## 2020-01-17 NOTE — Assessment & Plan Note (Signed)
Macular hole OD now closed with improved visual acuity improved from 20/70 to now 20/30  Patient will need to follow-up with Dr. Delton Prairie in the future as cataract in the right eye progresses and worsens

## 2020-01-17 NOTE — Assessment & Plan Note (Signed)
OD currently with only mild NSC changes.  I did describe to the patient the classic post vitrectomy myopia progression, which might require new lens prescription but also progressive clouding of the vision secondary to nuclear sclerosis centrally in the right eye.  This usually can only be corrected with cataract extraction intraocular lens placement in the future.

## 2020-02-19 DIAGNOSIS — Z20822 Contact with and (suspected) exposure to covid-19: Secondary | ICD-10-CM | POA: Diagnosis not present

## 2020-02-19 DIAGNOSIS — Z03818 Encounter for observation for suspected exposure to other biological agents ruled out: Secondary | ICD-10-CM | POA: Diagnosis not present

## 2020-04-18 ENCOUNTER — Other Ambulatory Visit: Payer: Self-pay

## 2020-04-18 DIAGNOSIS — B20 Human immunodeficiency virus [HIV] disease: Secondary | ICD-10-CM

## 2020-04-18 DIAGNOSIS — Z79899 Other long term (current) drug therapy: Secondary | ICD-10-CM

## 2020-04-18 DIAGNOSIS — Z113 Encounter for screening for infections with a predominantly sexual mode of transmission: Secondary | ICD-10-CM

## 2020-04-22 ENCOUNTER — Other Ambulatory Visit: Payer: 59

## 2020-04-23 ENCOUNTER — Other Ambulatory Visit (HOSPITAL_COMMUNITY)
Admission: RE | Admit: 2020-04-23 | Discharge: 2020-04-23 | Disposition: A | Discharge status: Home / Self Care | Payer: BC Managed Care – PPO | Source: Ambulatory Visit | Attending: Infectious Disease | Admitting: Infectious Disease

## 2020-04-23 ENCOUNTER — Other Ambulatory Visit: Payer: Self-pay

## 2020-04-23 ENCOUNTER — Other Ambulatory Visit: Payer: BC Managed Care – PPO

## 2020-04-23 DIAGNOSIS — Z113 Encounter for screening for infections with a predominantly sexual mode of transmission: Secondary | ICD-10-CM

## 2020-04-23 DIAGNOSIS — B20 Human immunodeficiency virus [HIV] disease: Secondary | ICD-10-CM | POA: Insufficient documentation

## 2020-04-23 DIAGNOSIS — Z79899 Other long term (current) drug therapy: Secondary | ICD-10-CM

## 2020-04-24 ENCOUNTER — Other Ambulatory Visit: Payer: Self-pay | Admitting: *Deleted

## 2020-04-24 DIAGNOSIS — B20 Human immunodeficiency virus [HIV] disease: Secondary | ICD-10-CM

## 2020-04-24 LAB — URINE CYTOLOGY ANCILLARY ONLY
Chlamydia: NEGATIVE
Comment: NEGATIVE
Comment: NORMAL
Neisseria Gonorrhea: NEGATIVE

## 2020-04-24 LAB — T-HELPER CELL (CD4) - (RCID CLINIC ONLY)
CD4 % Helper T Cell: 23 % — ABNORMAL LOW (ref 33–65)
CD4 T Cell Abs: 509 /uL (ref 400–1790)

## 2020-04-24 MED ORDER — BIKTARVY 50-200-25 MG PO TABS: 1.0000 | ORAL_TABLET | Freq: Every day | ORAL | 1 refills | Status: DC

## 2020-04-26 LAB — CBC WITH DIFFERENTIAL/PLATELET
Absolute Monocytes: 616 cells/uL (ref 200–950)
Basophils Absolute: 91 cells/uL (ref 0–200)
Basophils Relative: 1.6 %
Eosinophils Absolute: 211 cells/uL (ref 15–500)
Eosinophils Relative: 3.7 %
HCT: 50.1 % — ABNORMAL HIGH (ref 38.5–50.0)
Hemoglobin: 17 g/dL (ref 13.2–17.1)
Lymphs Abs: 2092 cells/uL (ref 850–3900)
MCH: 31.6 pg (ref 27.0–33.0)
MCHC: 33.9 g/dL (ref 32.0–36.0)
MCV: 93.1 fL (ref 80.0–100.0)
MPV: 10.5 fL (ref 7.5–12.5)
Monocytes Relative: 10.8 %
Neutro Abs: 2690 cells/uL (ref 1500–7800)
Neutrophils Relative %: 47.2 %
Platelets: 304 10*3/uL (ref 140–400)
RBC: 5.38 10*6/uL (ref 4.20–5.80)
RDW: 13.4 % (ref 11.0–15.0)
Total Lymphocyte: 36.7 %
WBC: 5.7 10*3/uL (ref 3.8–10.8)

## 2020-04-26 LAB — COMPLETE METABOLIC PANEL WITH GFR
AG Ratio: 1.7 (calc) (ref 1.0–2.5)
ALT: 41 U/L (ref 9–46)
AST: 25 U/L (ref 10–40)
Albumin: 4.7 g/dL (ref 3.6–5.1)
Alkaline phosphatase (APISO): 61 U/L (ref 36–130)
BUN: 14 mg/dL (ref 7–25)
CO2: 29 mmol/L (ref 20–32)
Calcium: 9.9 mg/dL (ref 8.6–10.3)
Chloride: 105 mmol/L (ref 98–110)
Creat: 1.24 mg/dL (ref 0.60–1.35)
GFR, Est African American: 79 mL/min/{1.73_m2} (ref >=60)
GFR, Est Non African American: 68 mL/min/{1.73_m2} (ref >=60)
Globulin: 2.7 g/dL (calc) (ref 1.9–3.7)
Glucose, Bld: 114 mg/dL — ABNORMAL HIGH (ref 65–99)
Potassium: 4.4 mmol/L (ref 3.5–5.3)
Sodium: 141 mmol/L (ref 135–146)
Total Bilirubin: 0.6 mg/dL (ref 0.2–1.2)
Total Protein: 7.4 g/dL (ref 6.1–8.1)

## 2020-04-26 LAB — LIPID PANEL
Cholesterol: 171 mg/dL (ref <200)
HDL: 48 mg/dL (ref >=40)
LDL Cholesterol (Calc): 102 mg/dL (calc) — ABNORMAL HIGH
Non-HDL Cholesterol (Calc): 123 mg/dL (calc) (ref <130)
Total CHOL/HDL Ratio: 3.6 (calc) (ref <5.0)
Triglycerides: 111 mg/dL (ref <150)

## 2020-04-26 LAB — HIV-1 RNA QUANT-NO REFLEX-BLD
HIV 1 RNA Quant: <20 Copies/mL
HIV-1 RNA Quant, Log: <1.3 Log cps/mL

## 2020-04-26 LAB — RPR: RPR Ser Ql: NONREACTIVE

## 2020-05-12 ENCOUNTER — Ambulatory Visit: Payer: 59 | Admitting: Infectious Disease

## 2020-05-12 ENCOUNTER — Ambulatory Visit (INDEPENDENT_AMBULATORY_CARE_PROVIDER_SITE_OTHER): Payer: BC Managed Care – PPO

## 2020-05-12 ENCOUNTER — Other Ambulatory Visit: Payer: Self-pay

## 2020-05-12 ENCOUNTER — Encounter: Payer: Self-pay | Admitting: Infectious Disease

## 2020-05-12 VITALS — BP 154/93 | HR 77 | Temp 97.8°F | Wt 266.0 lb

## 2020-05-12 DIAGNOSIS — B20 Human immunodeficiency virus [HIV] disease: Secondary | ICD-10-CM

## 2020-05-12 DIAGNOSIS — Z23 Encounter for immunization: Secondary | ICD-10-CM

## 2020-05-12 DIAGNOSIS — R739 Hyperglycemia, unspecified: Secondary | ICD-10-CM | POA: Diagnosis not present

## 2020-05-12 DIAGNOSIS — H43811 Vitreous degeneration, right eye: Secondary | ICD-10-CM | POA: Diagnosis not present

## 2020-05-12 HISTORY — DX: Hyperglycemia, unspecified: R73.9

## 2020-05-12 MED ORDER — BIKTARVY 50-200-25 MG PO TABS: 1.0000 | ORAL_TABLET | Freq: Every day | ORAL | 3 refills | Status: DC

## 2020-05-12 NOTE — Progress Notes (Signed)
Subjective:  Chief complaint follow-up for HIV disease   Patient ID: Tommy Galvan, male    DOB: [birth year], 49 y.o.   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 Time he was tried initially on Sustiva anTruvada t but was changed to Kaletra and Combivir I believe for better CNS penetration of the second regimen. In any case he responded well to that regimen and responded well to kaletra epzicom and then Prezista Norvir and epzicom and has had undetectable viral loads for years.  We changed him last to  2 tivicay and epzico and  then changed to [place] --> BIKTARVY  He is doing well with undetectable viral load.  The form from [facility] claiming he was nonadherent to his medications but he tells me that that is not the case and that he had to change places where his scripts were filled.  Certainly his history of labs with me showed a high level of adherence and this is clearly a mistake on the part of third-party vendor.  He is in need of a third COVID-19 vaccine and flu shot.  He has been gaining some weight and his blood sugars been little bit elevated so would like to screen for A1c.   Past Medical History:  Diagnosis Date  . Depression   . HIV disease ([facility]) 10/06/2015  . HIV encephalopathy ([facility]) 10/06/2015  . HIV infection ([place])   . Nuclear sclerotic cataract of both eyes 11/06/2019    Past Surgical History:  Procedure Laterality Date  . APPENDECTOMY      Family History  Problem Relation Age of Onset  . Cancer Father   . Hyperlipidemia Father   . Mental illness Brother   . Diabetes Maternal Grandmother   . Hyperlipidemia Maternal Grandfather   . Hypertension Maternal Grandfather   . Stroke Maternal Grandfather   . Cancer Maternal Grandfather   . Heart disease Paternal Grandmother   . Hyperlipidemia Paternal Grandmother   . Hypertension Paternal  Grandmother   . Stroke Paternal Grandmother   . Cancer Paternal Grandfather       Social History   Socioeconomic History  . Marital status: Single    Spouse name: Not on file  . Number of children: Not on file  . Years of education: Not on file  . Highest education level: Not on file  Occupational History  . Not on file  Tobacco Use  . Smoking status: Never Smoker  . Smokeless tobacco: Never Used  Substance and Sexual Activity  . Alcohol use: No  . Drug use: No  . Sexual activity: Yes    Partners: Male    Comment: declined condoms 05/12/20  Other Topics Concern  . Not on file  Social History Narrative  . Not on file   Social Determinants of Health   Financial Resource Strain:   . Difficulty of Paying Living Expenses: Not on file  Food Insecurity:   . Worried About Charity fundraiser in the Last Year: Not on file  . Ran Out of Food in the Last Year: Not on file  Transportation Needs:   . Lack of Transportation (Medical): Not on file  . Lack of Transportation (Non-Medical): Not on file  Physical Activity:   . Days of Exercise per Week: Not on file  . Minutes of Exercise per Session: Not on file  Stress:   .  Feeling of Stress : Not on file  Social Connections:   . Frequency of Communication with Friends and Family: Not on file  . Frequency of Social Gatherings with Friends and Family: Not on file  . Attends Religious Services: Not on file  . Active Member of Clubs or Organizations: Not on file  . Attends Archivist Meetings: Not on file  . Marital Status: Not on file    Allergies  Allergen Reactions  . Penicillins     REACTION: Reaction unknown; remote     Current Outpatient Medications:  .  bictegravir-emtricitabine-tenofovir AF (BIKTARVY) 50-200-25 MG TABS tablet, Take 1 tablet by mouth daily., Disp: 30 tablet, Rfl: 1 .  prednisoLONE acetate (PRED FORTE) 1 % ophthalmic suspension, Place 1 drop into the right eye 4 (four) times daily. (Patient  not taking: Reported on 05/12/2020), Disp: 5 mL, Rfl: 0  Review of Systems  Constitutional: Negative for activity change, appetite change, chills, diaphoresis, fatigue, fever and unexpected weight change.  HENT: Negative for congestion, rhinorrhea, sinus pressure, sneezing, sore throat and trouble swallowing.   Eyes: Negative for photophobia and visual disturbance.  Respiratory: Negative for cough, chest tightness, shortness of breath, wheezing and stridor.   Cardiovascular: Negative for chest pain, palpitations and leg swelling.  Gastrointestinal: Negative for abdominal distention, abdominal pain, anal bleeding, blood in stool, constipation, diarrhea, nausea and vomiting.  Genitourinary: Negative for difficulty urinating, dysuria, flank pain and hematuria.  Musculoskeletal: Negative for arthralgias, back pain, gait problem, joint swelling and myalgias.  Skin: Negative for color change, pallor, rash and wound.  Neurological: Negative for dizziness, tremors, weakness and light-headedness.  Hematological: Negative for adenopathy. Does not bruise/bleed easily.  Psychiatric/Behavioral: Negative for agitation, behavioral problems, confusion, decreased concentration, dysphoric mood and sleep disturbance.       Objective:   Physical Exam Constitutional:      General: He is not in acute distress.    Appearance: Normal appearance. He is well-developed. He is not ill-appearing or diaphoretic.  HENT:     Head: Normocephalic and atraumatic.     Right Ear: Hearing and external ear normal.     Left Ear: Hearing and external ear normal.     Nose: No nasal deformity or rhinorrhea.  Eyes:     General: No scleral icterus.    Conjunctiva/sclera: Conjunctivae normal.     Right eye: Right conjunctiva is not injected.     Left eye: Left conjunctiva is not injected.  Neck:     Vascular: No JVD.  Cardiovascular:     Rate and Rhythm: Normal rate and regular rhythm.     Heart sounds: Normal heart sounds,  S1 normal and S2 normal. No murmur heard.   Pulmonary:     Effort: Pulmonary effort is normal.     Breath sounds: Normal breath sounds.  Abdominal:     General: There is no distension.     Palpations: Abdomen is soft.  Musculoskeletal:        General: Normal range of motion.     Right shoulder: Normal.     Left shoulder: Normal.     Cervical back: Normal range of motion and neck supple.     Right hip: Normal.     Left hip: Normal.     Right knee: Normal.     Left knee: Normal.  Lymphadenopathy:     Head:     Right side of head: No submandibular, preauricular or posterior auricular adenopathy.     Left side  of head: No submandibular, preauricular or posterior auricular adenopathy.     Cervical: No cervical adenopathy.     Right cervical: No superficial or deep cervical adenopathy.    Left cervical: No superficial or deep cervical adenopathy.  Skin:    General: Skin is warm and dry.     Coloration: Skin is not pale.     Findings: No abrasion, bruising, ecchymosis, erythema, lesion or rash.     Nails: There is no clubbing.  Neurological:     General: No focal deficit present.     Mental Status: He is alert and oriented to person, place, and time.     Sensory: No sensory deficit.     Coordination: Coordination normal.     Gait: Gait normal.  Psychiatric:        Attention and Perception: He is attentive.        Mood and Affect: Mood normal.        Speech: Speech normal.        Behavior: Behavior normal. Behavior is cooperative.        Thought Content: Thought content normal.        Judgment: Judgment normal.           Assessment & Plan:  To be disease: Continue BIKTARVY return to clinic in 1 year.  Hyperglycemia check A1c  Weight gain work on weight loss or carbohydrate restriction.  Need for primary care have referred him to Pricilla Holm with [facility]  Vitreous detachment of right eye: Followed by [name].

## 2020-05-12 NOTE — Progress Notes (Signed)
   Covid-19 Vaccination Clinic  Name:  Tommy Galvan    MRN: [id number] DOB: 01-04-1971  05/12/2020  Tommy Galvan was observed post Covid-19 immunization for 15 minutes without incident. He was provided with Vaccine Information Sheet and instruction to access the V-Safe system.   Tommy Galvan was instructed to call 911 with any severe reactions post vaccine: Marland Kitchen Difficulty breathing  . Swelling of face and throat  . A fast heartbeat  . A bad rash all over body  . Dizziness and weakness     Wreatha Sturgeon T Brooks Sailors

## 2020-05-13 LAB — HEMOGLOBIN A1C
Hgb A1c MFr Bld: 5.3 % of total Hgb (ref <5.7)
Mean Plasma Glucose: 105 (calc)
eAG (mmol/L): 5.8 (calc)

## 2020-06-09 ENCOUNTER — Other Ambulatory Visit: Payer: BC Managed Care – PPO

## 2020-07-21 ENCOUNTER — Other Ambulatory Visit: Payer: Self-pay

## 2020-07-21 ENCOUNTER — Encounter (INDEPENDENT_AMBULATORY_CARE_PROVIDER_SITE_OTHER): Payer: Self-pay | Admitting: Ophthalmology

## 2020-07-21 ENCOUNTER — Ambulatory Visit (INDEPENDENT_AMBULATORY_CARE_PROVIDER_SITE_OTHER): Payer: BC Managed Care – PPO | Admitting: Ophthalmology

## 2020-07-21 DIAGNOSIS — H35341 Macular cyst, hole, or pseudohole, right eye: Secondary | ICD-10-CM

## 2020-07-21 DIAGNOSIS — H43822 Vitreomacular adhesion, left eye: Secondary | ICD-10-CM | POA: Diagnosis not present

## 2020-07-21 DIAGNOSIS — H2513 Age-related nuclear cataract, bilateral: Secondary | ICD-10-CM | POA: Diagnosis not present

## 2020-07-21 NOTE — Assessment & Plan Note (Addendum)
I discussed with patient progressive nuclear sclerotic cataractous changes likely recur over the next 6 to 12 months which may require cataract surgery, in the right eye, post vitrectomy

## 2020-07-21 NOTE — Assessment & Plan Note (Signed)
Macular hole closed OD post vitrectomy membrane peel, April 2021

## 2020-07-21 NOTE — Progress Notes (Signed)
07/21/2020     CHIEF COMPLAINT Patient presents for Retina Follow Up (6 Month f\u OU. OCT/Pt states no changes or issues.)   HISTORY OF PRESENT ILLNESS: Tommy Galvan is a 50 y.o. male who presents to the clinic today for:   HPI    Retina Follow Up    Diagnosis: [place].  In right eye.  Severity is moderate.  Duration of 6 months.  Since onset it is stable.  I, the attending physician,  performed the HPI with the patient and updated documentation appropriately. Additional comments: 6 Month f\u OU. OCT Pt states no changes or issues.       Last edited by [name] on 07/21/2020 10:41 AM. (History)      Referring physician: Jacelyn Pi, Lilia Argue, MD [address] [place],  [place] [address]  HISTORICAL INFORMATION:   Selected notes from the MEDICAL RECORD NUMBER    Lab Results  Component Value Date   HGBA1C 5.3 05/12/2020     CURRENT MEDICATIONS: Current Outpatient Medications (Ophthalmic Drugs)  Medication Sig  . prednisoLONE acetate (PRED FORTE) 1 % ophthalmic suspension Place 1 drop into the right eye 4 (four) times daily. (Patient not taking: Reported on 05/12/2020)   No current facility-administered medications for this visit. (Ophthalmic Drugs)   Current Outpatient Medications (Other)  Medication Sig  . bictegravir-emtricitabine-tenofovir AF (BIKTARVY) 50-200-25 MG TABS tablet Take 1 tablet by mouth daily.   No current facility-administered medications for this visit. (Other)      REVIEW OF SYSTEMS:    ALLERGIES Allergies  Allergen Reactions  . Penicillins     REACTION: Reaction unknown; remote    PAST MEDICAL HISTORY Past Medical History:  Diagnosis Date  . Depression   . Elevated blood sugar 05/12/2020  . 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 HIV infection ([place])   . Nuclear sclerotic cataract of both eyes 11/06/2019   Past Surgical History:  Procedure Laterality Date  . APPENDECTOMY       FAMILY HISTORY Family History  Problem Relation Age of Onset  . Cancer Father   . Hyperlipidemia Father   . Mental illness Brother   . Diabetes Maternal Grandmother   . Hyperlipidemia Maternal Grandfather   . Hypertension Maternal Grandfather   . Stroke Maternal Grandfather   . Cancer Maternal Grandfather   . Heart disease Paternal Grandmother   . Hyperlipidemia Paternal Grandmother   . Hypertension Paternal Grandmother   . Stroke Paternal Grandmother   . Cancer Paternal Grandfather     SOCIAL HISTORY Social History   Tobacco Use  . Smoking status: Never Smoker  . Smokeless tobacco: Never Used  Substance Use Topics  . Alcohol use: No  . Drug use: No         OPHTHALMIC EXAM: Base Eye Exam    Visual Acuity (Snellen - Linear)      Right Left   Dist cc 20/30 + 20/20 -1   Correction: Glasses       Tonometry (Tonopen, 10:46 AM)      Right Left   Pressure 14 14       Pupils      Pupils Dark Light Shape React APD   Right PERRL 4 3 Round Brisk None   Left PERRL 4 3 Round Brisk None       Visual Fields (Counting fingers)      Left Right    Full Full  Neuro/Psych    Oriented x3: Yes   Mood/Affect: Normal       Dilation    Both eyes: 1.0% Mydriacyl, 2.5% Phenylephrine @ 10:46 AM        Slit Lamp and Fundus Exam    External Exam      Right Left   External Normal Normal       Slit Lamp Exam      Right Left   Lids/Lashes Normal Normal   Conjunctiva/Sclera White and quiet White and quiet   Cornea Clear Clear   Anterior Chamber Deep and quiet Deep and quiet   Iris Round and reactive Round and reactive   Lens 1+ Nuclear sclerosis 1+ Nuclear sclerosis   Anterior Vitreous Normal Normal       Fundus Exam      Right Left   Posterior Vitreous Vitrectomized clear Normal   Disc Normal Normal   C/D Ratio 0.3 0.1   Macula Macular hole closed Normal   Vessels Normal Normal   Periphery Normal Normal          IMAGING AND PROCEDURES   Imaging and Procedures for 07/21/20  OCT, Retina - OU - Both Eyes       Right Eye Quality was good. Central Foveal Thickness: 277. Progression has improved. Findings include abnormal foveal contour.   Left Eye Quality was good. Scan locations included subfoveal. Central Foveal Thickness: 273. Progression has been stable. Findings include vitreomacular adhesion , normal foveal contour.   Notes Classic post macular hole repair changes, with mild atrophy of the macula temporal to the fovea, OD, macular hole closed  OS no inner foveal, macular distortion from mild VMT                ASSESSMENT/PLAN:  Nuclear sclerotic cataract of both eyes I discussed with patient progressive nuclear sclerotic cataractous changes likely recur over the next 6 to 12 months which may require cataract surgery, in the right eye, post vitrectomy  Macular hole of right eye Macular hole closed OD post vitrectomy membrane peel, April 2021  Vitreomacular adhesion of left eye Minor with no inner foveal distortion or changes to date      ICD-10-CM   1. 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 Nuclear sclerotic cataract of both eyes  H25.13   3. Vitreomacular adhesion of left eye  H43.822     1.  OD, vastly improved visual acuity.  Patient instructed in monitoring at home monocular vision and should the visual acuity in the left right eye began to decline from its current state he should follow-up with Dr. Macarthur Critchley, [place], and monitor for central nuclear sclerosis development OD.  Typically the post vitrectomized eye will develop central nuclear sclerosis with what appears to be scattering of light but the central hardening of the nucleus can still look relatively clear particularly in the dilated pupil with the outer cortex is very clear.  This type of cataract is classically seen in the post vitrectomized eye.  I explained to [name] this could occur anytime  over the next few months up to the next 5 to 10 years.  Typically within 1 year post vitrectomy in his age group  2.  3.  Ophthalmic Meds Ordered this visit:  No orders of the defined types were placed in this encounter.      Return in about 2 years (around 07/21/2022) for DILATE OU, OCT.  There are no Patient Instructions on file for this visit.   Explained the diagnoses, plan, and follow up with the patient and they expressed understanding.  Patient expressed understanding of the importance of proper follow up care.   Clent Demark Brantlee Hinde M.D. Diseases & Surgery of the Retina and Vitreous [facility]ina [facility]abetic [place] 07/21/20     Abbreviations: M myopia (nearsighted); A astigmatism; H hyperopia (farsighted); P presbyopia; Mrx spectacle prescription;  CTL contact lenses; OD right eye; OS left eye; OU both eyes  XT exotropia; ET esotropia; PEK punctate epithelial keratitis; PEE punctate epithelial erosions; DES dry eye syndrome; MGD meibomian gland dysfunction; ATs artificial tears; PFAT's preservative free artificial tears; [place] nuclear sclerotic cataract; PSC posterior subcapsular cataract; ERM epi-retinal membrane; PVD posterior vitreous detachment; RD retinal detachment; DM diabetes mellitus; DR diabetic retinopathy; NPDR non-proliferative diabetic retinopathy; PDR proliferative diabetic retinopathy; CSME clinically significant macular edema; DME diabetic macular edema; dbh dot blot hemorrhages; CWS cotton wool spot; POAG primary open angle glaucoma; C/D cup-to-disc ratio; HVF humphrey visual field; GVF goldmann visual field; OCT optical coherence tomography; IOP intraocular pressure; BRVO Branch retinal vein occlusion; CRVO central retinal vein occlusion; CRAO central retinal artery occlusion; BRAO branch retinal artery occlusion; RT retinal tear; SB scleral buckle; PPV pars plana vitrectomy; VH Vitreous hemorrhage; PRP panretinal laser photocoagulation; IVK intravitreal kenalog; VMT  vitreomacular traction; MH Macular hole;  NVD neovascularization of the disc; NVE neovascularization elsewhere; AREDS age related eye disease study; ARMD age related macular degeneration; POAG primary open angle glaucoma; EBMD epithelial/anterior basement membrane dystrophy; ACIOL anterior chamber intraocular lens; IOL intraocular lens; PCIOL posterior chamber intraocular lens; Phaco/IOL phacoemulsification with intraocular lens placement; [place] photorefractive keratectomy; LASIK laser assisted in situ keratomileusis; HTN hypertension; DM diabetes mellitus; COPD chronic obstructive pulmonary disease

## 2020-07-21 NOTE — Assessment & Plan Note (Signed)
Minor with no inner foveal distortion or changes to date

## 2020-10-12 DIAGNOSIS — Z03818 Encounter for observation for suspected exposure to other biological agents ruled out: Secondary | ICD-10-CM | POA: Diagnosis not present

## 2020-10-12 DIAGNOSIS — Z20822 Contact with and (suspected) exposure to covid-19: Secondary | ICD-10-CM | POA: Diagnosis not present

## 2021-03-06 ENCOUNTER — Other Ambulatory Visit: Payer: Self-pay

## 2021-03-06 DIAGNOSIS — B20 Human immunodeficiency virus [HIV] disease: Secondary | ICD-10-CM

## 2021-03-06 MED ORDER — BIKTARVY 50-200-25 MG PO TABS: 1.0000 | ORAL_TABLET | Freq: Every day | ORAL | 0 refills | Status: DC

## 2021-03-13 ENCOUNTER — Other Ambulatory Visit: Payer: Self-pay | Admitting: Infectious Disease

## 2021-03-13 ENCOUNTER — Other Ambulatory Visit: Payer: BC Managed Care – PPO

## 2021-03-13 ENCOUNTER — Other Ambulatory Visit: Payer: Self-pay

## 2021-03-13 DIAGNOSIS — B20 Human immunodeficiency virus [HIV] disease: Secondary | ICD-10-CM

## 2021-03-13 DIAGNOSIS — R739 Hyperglycemia, unspecified: Secondary | ICD-10-CM | POA: Diagnosis not present

## 2021-03-16 ENCOUNTER — Other Ambulatory Visit: Payer: Self-pay

## 2021-03-16 DIAGNOSIS — B20 Human immunodeficiency virus [HIV] disease: Secondary | ICD-10-CM

## 2021-03-16 LAB — CBC WITH DIFFERENTIAL/PLATELET
Absolute Monocytes: 546 cells/uL (ref 200–950)
Basophils Absolute: 62 cells/uL (ref 0–200)
Basophils Relative: 1.2 %
Eosinophils Absolute: 120 cells/uL (ref 15–500)
Eosinophils Relative: 2.3 %
HCT: 48.7 % (ref 38.5–50.0)
Hemoglobin: 16.9 g/dL (ref 13.2–17.1)
Lymphs Abs: 1815 cells/uL (ref 850–3900)
MCH: 32.8 pg (ref 27.0–33.0)
MCHC: 34.7 g/dL (ref 32.0–36.0)
MCV: 94.4 fL (ref 80.0–100.0)
MPV: 10.9 fL (ref 7.5–12.5)
Monocytes Relative: 10.5 %
Neutro Abs: 2657 cells/uL (ref 1500–7800)
Neutrophils Relative %: 51.1 %
Platelets: 304 10*3/uL (ref 140–400)
RBC: 5.16 10*6/uL (ref 4.20–5.80)
RDW: 13.5 % (ref 11.0–15.0)
Total Lymphocyte: 34.9 %
WBC: 5.2 10*3/uL (ref 3.8–10.8)

## 2021-03-16 LAB — LIPID PANEL
Cholesterol: 156 mg/dL (ref <200)
HDL: 44 mg/dL (ref >=40)
LDL Cholesterol (Calc): 94 mg/dL (calc)
Non-HDL Cholesterol (Calc): 112 mg/dL (calc) (ref <130)
Total CHOL/HDL Ratio: 3.5 (calc) (ref <5.0)
Triglycerides: 85 mg/dL (ref <150)

## 2021-03-16 LAB — COMPLETE METABOLIC PANEL WITH GFR
AG Ratio: 1.7 (calc) (ref 1.0–2.5)
ALT: 40 U/L (ref 9–46)
AST: 27 U/L (ref 10–40)
Albumin: 4.6 g/dL (ref 3.6–5.1)
Alkaline phosphatase (APISO): 60 U/L (ref 36–130)
BUN: 16 mg/dL (ref 7–25)
CO2: 30 mmol/L (ref 20–32)
Calcium: 9.9 mg/dL (ref 8.6–10.3)
Chloride: 105 mmol/L (ref 98–110)
Creat: 1.11 mg/dL (ref 0.60–1.29)
Globulin: 2.7 g/dL (calc) (ref 1.9–3.7)
Glucose, Bld: 109 mg/dL — ABNORMAL HIGH (ref 65–99)
Potassium: 4.2 mmol/L (ref 3.5–5.3)
Sodium: 142 mmol/L (ref 135–146)
Total Bilirubin: 0.6 mg/dL (ref 0.2–1.2)
Total Protein: 7.3 g/dL (ref 6.1–8.1)
eGFR: 81 mL/min/{1.73_m2} (ref >=60)

## 2021-03-16 LAB — RPR: RPR Ser Ql: NONREACTIVE

## 2021-03-16 LAB — HIV-1 RNA QUANT-NO REFLEX-BLD
HIV 1 RNA Quant: NOT DETECTED Copies/mL
HIV-1 RNA Quant, Log: NOT DETECTED Log cps/mL

## 2021-03-16 LAB — T-HELPER CELLS (CD4) COUNT (NOT AT ARMC)
Absolute CD4: 450 cells/uL — ABNORMAL LOW (ref 490–1740)
CD4 T Helper %: 23 % — ABNORMAL LOW (ref 30–61)
Total lymphocyte count: 1925 cells/uL (ref 850–3900)

## 2021-03-16 MED ORDER — BIKTARVY 50-200-25 MG PO TABS
1.0000 | ORAL_TABLET | Freq: Every day | ORAL | 0 refills | Status: DC
Start: 2021-03-16 — End: 2021-03-27

## 2021-03-27 ENCOUNTER — Other Ambulatory Visit (HOSPITAL_COMMUNITY): Payer: Self-pay

## 2021-03-27 ENCOUNTER — Encounter: Payer: Self-pay | Admitting: Infectious Disease

## 2021-03-27 ENCOUNTER — Other Ambulatory Visit: Payer: Self-pay

## 2021-03-27 ENCOUNTER — Ambulatory Visit: Payer: BC Managed Care – PPO | Admitting: Infectious Disease

## 2021-03-27 VITALS — BP 149/94 | HR 71 | Temp 98.0°F | Resp 16 | Ht 70.35 in | Wt 265.0 lb

## 2021-03-27 DIAGNOSIS — I10 Essential (primary) hypertension: Secondary | ICD-10-CM

## 2021-03-27 DIAGNOSIS — H43811 Vitreous degeneration, right eye: Secondary | ICD-10-CM

## 2021-03-27 DIAGNOSIS — Z6836 Body mass index (BMI) 36.0-36.9, adult: Secondary | ICD-10-CM

## 2021-03-27 DIAGNOSIS — E669 Obesity, unspecified: Secondary | ICD-10-CM

## 2021-03-27 DIAGNOSIS — B20 Human immunodeficiency virus [HIV] disease: Secondary | ICD-10-CM | POA: Diagnosis not present

## 2021-03-27 DIAGNOSIS — H35341 Macular cyst, hole, or pseudohole, right eye: Secondary | ICD-10-CM

## 2021-03-27 DIAGNOSIS — H2513 Age-related nuclear cataract, bilateral: Secondary | ICD-10-CM

## 2021-03-27 DIAGNOSIS — R739 Hyperglycemia, unspecified: Secondary | ICD-10-CM

## 2021-03-27 HISTORY — DX: Essential (primary) hypertension: I10

## 2021-03-27 MED ORDER — BIKTARVY 50-200-25 MG PO TABS
1.0000 | ORAL_TABLET | Freq: Every day | ORAL | 11 refills | Status: DC
  Filled 2021-03-27 – 2021-06-10 (×3): qty 30, 30d supply, fill #0

## 2021-03-27 NOTE — Progress Notes (Signed)
Chief complaint follow-up for HIV disease on medications still with concerns about hyperglycemia   Patient ID: Tommy Galvan, male    DOB: 03/08/1971, 49 y.o.   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 Time he was tried initially on Sustiva anTruvada t but was changed to Kaletra and Combivir I believe for better CNS penetration of the second regimen. In any case he responded well to that regimen and responded well to kaletra epzicom and then Prezista Norvir and epzicom and has had undetectable viral loads for years.   We changed him last to  2 tivicay and epzico and  then changed to [name].  He continues to maintain perfect virological suppression.  He did suffer a macular hole in the right eye which was repaired with postvitrectomy membrane peel on April 2021.  He also has a vitreal macular adhesion in the left eye nuclear sclerotic cataract in both eyes.  He has been following with [name] for this.  His blood pressure was again elevated today though it potentially might be whitecoat hypertension he does not have a primary care physician still.  He does remain overweight and needs to lose weight.  A1c was normal when he checked it last time when he had concerned about hyperglycemia.       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   HIV infection ([name])    Nuclear sclerotic cataract of both eyes 11/06/2019    Past Surgical History:  Procedure Laterality Date   APPENDECTOMY      Family History  Problem Relation Age of Onset   Cancer Father    Hyperlipidemia Father    Mental illness Brother    Diabetes Maternal Grandmother    Hyperlipidemia Maternal Grandfather    Hypertension Maternal Grandfather    Stroke Maternal Grandfather     Cancer Maternal Grandfather    Heart disease Paternal Grandmother    Hyperlipidemia Paternal Grandmother    Hypertension Paternal Grandmother    Stroke Paternal Grandmother    Cancer Paternal Grandfather       Social History   Socioeconomic History   Marital status: Single    Spouse name: Not on file   Number of children: Not on file   Years of education: Not on file   Highest education level: Not on file  Occupational History   Not on file  Tobacco Use   Smoking status: Never   Smokeless tobacco: Never  Substance and Sexual Activity   Alcohol use: No   Drug use: No   Sexual activity: Yes    Partners: Male    Comment: declined condoms 05/12/20  Other Topics Concern   Not on file  Social History Narrative   Not on file   Social Determinants of Health   Financial Resource Strain: Not on file  Food Insecurity: Not on file  Transportation Needs: Not on file  Physical Activity: Not on file  Stress: Not on file  Social Connections: Not on file    Allergies  Allergen Reactions   Penicillins     REACTION: Reaction unknown; remote     Current Outpatient Medications:    bictegravir-emtricitabine-tenofovir AF (BIKTARVY) 50-200-25 MG TABS tablet, Take 1 tablet by mouth daily., Disp: 30 tablet,  Rfl: 0   prednisoLONE acetate (PRED FORTE) 1 % ophthalmic suspension, Place 1 drop into the right eye 4 (four) times daily. (Patient not taking: Reported on 05/12/2020), Disp: 5 mL, Rfl: 0  Review of Systems  Constitutional:  Negative for chills and fever.  HENT:  Negative for congestion and sore throat.   Eyes:  Negative for photophobia.  Respiratory:  Negative for cough, shortness of breath and wheezing.   Cardiovascular:  Negative for chest pain, palpitations and leg swelling.  Gastrointestinal:  Negative for abdominal pain, blood in stool, constipation, diarrhea, nausea and vomiting.  Genitourinary:  Negative for dysuria, flank pain and hematuria.  Musculoskeletal:   Negative for back pain and myalgias.  Skin:  Negative for rash.  Neurological:  Negative for dizziness, weakness and headaches.  Hematological:  Does not bruise/bleed easily.  Psychiatric/Behavioral:  Negative for suicidal ideas.       Objective:   Physical Exam Constitutional:      Appearance: He is well-developed.  HENT:     Head: Normocephalic and atraumatic.  Eyes:     Conjunctiva/sclera: Conjunctivae normal.  Cardiovascular:     Rate and Rhythm: Normal rate and regular rhythm.  Pulmonary:     Effort: Pulmonary effort is normal. No respiratory distress.     Breath sounds: No wheezing.  Abdominal:     General: There is no distension.     Palpations: Abdomen is soft.  Musculoskeletal:        General: No tenderness. Normal range of motion.     Cervical back: Normal range of motion and neck supple.  Skin:    General: Skin is warm and dry.     Coloration: Skin is not pale.     Findings: No erythema or rash.  Neurological:     General: No focal deficit present.     Mental Status: He is alert and oriented to person, place, and time.  Psychiatric:        Mood and Affect: Mood normal.        Behavior: Behavior normal.        Thought Content: Thought content normal.        Judgment: Judgment normal.          Assessment & Plan:  HIV disease:  I reviewed his most recent viral load which was not detected on March 13, 2021 and his CD4 count that was 450.  We will continue his [name] but endeavor to fill it through [facility].  We will not bill to run a test prescription fill for [name] till November since she just filled a 90-day supply.  He is going to come back and see me in 1 years time I am reordering viral load CD4 count CMP lipid panel syphilis testing gonorrhea chlamydia testing for 1 years time.  Obesity: Needs to work on losing weight through carbohydrate restriction and exercise.  Hyperglycemia: Likely to the fact eaten his A1c is  normal.  Hypertension: I have asked him to monitor BP at home with BP cuff and to establish with PCP  Vitals:   03/27/21 1015  BP: (!) 149/94  Pulse: 71  Resp: 16  Temp: 98 F (36.7 C)  SpO2: 99%    Seen counseling of counseled him to get the updated BA 4/BA 5 by Bi-valent vaccine for COVID-19

## 2021-03-28 ENCOUNTER — Other Ambulatory Visit (HOSPITAL_COMMUNITY): Payer: Self-pay

## 2021-04-03 ENCOUNTER — Other Ambulatory Visit (HOSPITAL_COMMUNITY): Payer: Self-pay

## 2021-04-06 ENCOUNTER — Other Ambulatory Visit (HOSPITAL_COMMUNITY): Payer: Self-pay

## 2021-04-15 DIAGNOSIS — Z03818 Encounter for observation for suspected exposure to other biological agents ruled out: Secondary | ICD-10-CM | POA: Diagnosis not present

## 2021-04-15 DIAGNOSIS — Z20822 Contact with and (suspected) exposure to covid-19: Secondary | ICD-10-CM | POA: Diagnosis not present

## 2021-04-15 DIAGNOSIS — J0100 Acute maxillary sinusitis, unspecified: Secondary | ICD-10-CM | POA: Diagnosis not present

## 2021-06-10 ENCOUNTER — Other Ambulatory Visit (HOSPITAL_COMMUNITY): Payer: Self-pay

## 2021-06-10 ENCOUNTER — Other Ambulatory Visit: Payer: Self-pay

## 2021-06-10 ENCOUNTER — Telehealth: Payer: Self-pay

## 2021-06-10 DIAGNOSIS — B20 Human immunodeficiency virus [HIV] disease: Secondary | ICD-10-CM

## 2021-06-10 MED ORDER — BIKTARVY 50-200-25 MG PO TABS
1.0000 | ORAL_TABLET | Freq: Every day | ORAL | 3 refills | Status: DC
  Filled 2021-06-10 – 2021-06-30 (×2): qty 90, 90d supply, fill #0
  Filled 2021-09-22: qty 90, 90d supply, fill #1
  Filled 2021-12-10: qty 90, 90d supply, fill #2
  Filled 2022-03-08: qty 90, 90d supply, fill #3

## 2021-06-10 NOTE — Telephone Encounter (Signed)
[facility] Patient Advocate Encounter °  °Was successful in obtaining a [employer] copay card for Biktarvy.  This copay card will make the patients copay 0.00. ° °I have spoken with the patient.   ° °The billing information is as follows and has been shared with [facility]. ° ° ° ° ° ° ° °Casyn Becvar, CPhT °Specialty Pharmacy Patient Advocate °[facility] °Phone: [phone] °Fax:  [phone]  °

## 2021-06-10 NOTE — Telephone Encounter (Signed)
Patient requesting 90 day supply of Biktarvy, Dr. Tommy Medal had sent in #30 with 11 refills. Will switch over to 90 day supply at a time.   Beryle Flock, RN

## 2021-06-30 ENCOUNTER — Other Ambulatory Visit (HOSPITAL_COMMUNITY): Payer: Self-pay

## 2021-07-03 ENCOUNTER — Other Ambulatory Visit (HOSPITAL_COMMUNITY): Payer: Self-pay

## 2021-09-22 ENCOUNTER — Other Ambulatory Visit (HOSPITAL_COMMUNITY): Payer: Self-pay

## 2021-10-09 DIAGNOSIS — R946 Abnormal results of thyroid function studies: Secondary | ICD-10-CM | POA: Diagnosis not present

## 2021-10-09 DIAGNOSIS — R739 Hyperglycemia, unspecified: Secondary | ICD-10-CM | POA: Diagnosis not present

## 2021-10-09 DIAGNOSIS — B20 Human immunodeficiency virus [HIV] disease: Secondary | ICD-10-CM | POA: Diagnosis not present

## 2021-10-09 DIAGNOSIS — R030 Elevated blood-pressure reading, without diagnosis of hypertension: Secondary | ICD-10-CM | POA: Diagnosis not present

## 2021-10-12 DIAGNOSIS — R030 Elevated blood-pressure reading, without diagnosis of hypertension: Secondary | ICD-10-CM | POA: Diagnosis not present

## 2021-10-12 DIAGNOSIS — R7989 Other specified abnormal findings of blood chemistry: Secondary | ICD-10-CM | POA: Diagnosis not present

## 2021-10-12 DIAGNOSIS — L918 Other hypertrophic disorders of the skin: Secondary | ICD-10-CM | POA: Diagnosis not present

## 2021-11-01 ENCOUNTER — Emergency Department
Admission: EM | Admit: 2021-11-01 | Discharge: 2021-11-01 | Discharge status: Against Medical Advice | Payer: Medicaid Other | Attending: Emergency Medicine | Admitting: Emergency Medicine

## 2021-11-01 ENCOUNTER — Inpatient Hospital Stay (HOSPITAL_BASED_OUTPATIENT_CLINIC_OR_DEPARTMENT_OTHER): Payer: Medicaid Other | Admitting: Emergency Medicine

## 2021-11-01 ENCOUNTER — Encounter (HOSPITAL_BASED_OUTPATIENT_CLINIC_OR_DEPARTMENT_OTHER): Payer: Self-pay

## 2021-11-01 ENCOUNTER — Encounter (HOSPITAL_COMMUNITY): Payer: Self-pay

## 2021-11-01 ENCOUNTER — Other Ambulatory Visit: Payer: Self-pay

## 2021-11-01 ENCOUNTER — Inpatient Hospital Stay (HOSPITAL_PSYCHIATRIC): Admit: 2021-11-01 | Payer: Medicaid Other | Source: Other Acute Inpatient Hospital | Admitting: Psychiatry

## 2021-11-01 DIAGNOSIS — Z1211 Encounter for screening for malignant neoplasm of colon: Secondary | ICD-10-CM | POA: Diagnosis not present

## 2021-11-01 DIAGNOSIS — Z1212 Encounter for screening for malignant neoplasm of rectum: Secondary | ICD-10-CM | POA: Diagnosis not present

## 2021-11-01 DIAGNOSIS — F1510 Other stimulant abuse, uncomplicated: Secondary | ICD-10-CM | POA: Insufficient documentation

## 2021-11-01 DIAGNOSIS — N179 Acute kidney failure, unspecified: Secondary | ICD-10-CM | POA: Insufficient documentation

## 2021-11-01 DIAGNOSIS — N184 Chronic kidney disease, stage 4 (severe): Secondary | ICD-10-CM | POA: Insufficient documentation

## 2021-11-01 DIAGNOSIS — Z6823 Body mass index (BMI) 23.0-23.9, adult: Secondary | ICD-10-CM

## 2021-11-01 DIAGNOSIS — F1910 Other psychoactive substance abuse, uncomplicated: Principal | ICD-10-CM

## 2021-11-01 HISTORY — DX: Other psychoactive substance use, unspecified, uncomplicated: F19.90

## 2021-11-01 LAB — CBC WITH DIFF
BASOPHIL #: 0 10*3/uL (ref 0.00–0.30)
BASOPHIL %: 0 % (ref 0–3)
EOSINOPHIL #: 0 10*3/uL (ref 0.00–0.80)
EOSINOPHIL %: 0 % (ref 0–7)
HCT: 37.6 % — ABNORMAL LOW (ref 42.0–51.0)
HGB: 13 g/dL — ABNORMAL LOW (ref 13.5–18.0)
LYMPHOCYTE #: 0.9 10*3/uL — ABNORMAL LOW (ref 1.10–5.00)
LYMPHOCYTE %: 4 % — ABNORMAL LOW (ref 25–45)
MCH: 30.1 pg (ref 27.0–32.0)
MCHC: 34.6 g/dL (ref 32.0–36.0)
MCV: 86.9 fL (ref 78.0–99.0)
MONOCYTE #: 2.1 10*3/uL — ABNORMAL HIGH (ref 0.00–1.30)
MONOCYTE %: 9 % (ref 0–12)
MPV: 8 fL (ref 7.4–10.4)
NEUTROPHIL #: 19.3 10*3/uL — ABNORMAL HIGH (ref 1.80–8.40)
NEUTROPHIL %: 86 % — ABNORMAL HIGH (ref 40–76)
PLATELETS: 304 10*3/uL (ref 140–440)
RBC: 4.32 10*6/uL (ref 4.20–6.00)
RDW: 13.5 % (ref 11.6–14.8)
WBC: 22.3 10*3/uL — ABNORMAL HIGH (ref 4.0–10.5)
WBCS UNCORRECTED: 22.3 10*3/uL

## 2021-11-01 LAB — URINALYSIS, MICROSCOPIC

## 2021-11-01 LAB — BASIC METABOLIC PANEL
ANION GAP: 24 mmol/L — ABNORMAL HIGH (ref 10–20)
ANION GAP: 13 mmol/L (ref 10–20)
BUN/CREA RATIO: 18
BUN/CREA RATIO: 36
BUN: 65 mg/dL — ABNORMAL HIGH (ref 7–18)
BUN: 73 mg/dL — ABNORMAL HIGH (ref 7–18)
CALCIUM: 8.7 mg/dL (ref 8.5–10.1)
CALCIUM: 8.5 mg/dL (ref 8.5–10.1)
CHLORIDE: 104 mmol/L (ref 98–107)
CHLORIDE: 107 mmol/L (ref 98–107)
CO2 TOTAL: 17 mmol/L — ABNORMAL LOW (ref 21–32)
CO2 TOTAL: 22 mmol/L (ref 21–32)
CREATININE: 3.67 mg/dL — ABNORMAL HIGH (ref 0.70–1.30)
CREATININE: 2.01 mg/dL — ABNORMAL HIGH (ref 0.70–1.30)
ESTIMATED GFR: 19 mL/min/{1.73_m2} — ABNORMAL LOW (ref >59)
ESTIMATED GFR: 40 mL/min/{1.73_m2} — ABNORMAL LOW (ref >59)
GLUCOSE: 123 mg/dL — ABNORMAL HIGH (ref 74–106)
GLUCOSE: 105 mg/dL (ref 74–106)
OSMOLALITY, CALCULATED: 309 mOsm/kg — ABNORMAL HIGH (ref 270–290)
OSMOLALITY, CALCULATED: 305 mOsm/kg — ABNORMAL HIGH (ref 270–290)
POTASSIUM: 4.8 mmol/L (ref 3.5–5.1)
POTASSIUM: 4.1 mmol/L (ref 3.5–5.1)
SODIUM: 145 mmol/L (ref 136–145)
SODIUM: 142 mmol/L (ref 136–145)

## 2021-11-01 LAB — URINALYSIS, MACRO/MICRO
GLUCOSE: NEGATIVE mg/dL
KETONES: NEGATIVE mg/dL
LEUKOCYTES: NEGATIVE WBCs/uL
NITRITE: NEGATIVE
PH: 5.5 (ref 4.6–8.0)
PROTEIN: 100 mg/dL — AB
SPECIFIC GRAVITY: >=1.03 (ref 1.003–1.035)
UROBILINOGEN: 0.2 mg/dL (ref 0.2–1.0)

## 2021-11-01 LAB — URINE DRUG SCREEN
AMPHETAMINES URINE: POSITIVE — AB
BARBITURATES URINE: NEGATIVE
BENZODIAZEPINES URINE: NEGATIVE
CANNABINOIDS URINE: POSITIVE — AB
COCAINE METABOLITES URINE: NEGATIVE
METHADONE URINE: NEGATIVE
OPIATES URINE: NEGATIVE
PCP URINE: NEGATIVE

## 2021-11-01 LAB — MAGNESIUM
MAGNESIUM: 2 mg/dL (ref 1.8–2.4)
MAGNESIUM: 2.3 mg/dL (ref 1.8–2.4)

## 2021-11-01 LAB — ETHANOL, SERUM: ETHANOL: <3 mg/dL (ref <=3)

## 2021-11-01 LAB — ACETAMINOPHEN LEVEL: ACETAMINOPHEN LEVEL: 0

## 2021-11-01 LAB — SALICYLATE ACID LEVEL: SALICYLATE LEVEL: 2 mg/dL — ABNORMAL LOW (ref 3–20)

## 2021-11-01 MED ORDER — LORAZEPAM 2 MG/ML INJECTION WRAPPER
3.0000 mg | INTRAMUSCULAR | Status: DC
Start: 2021-11-01 — End: 2021-11-01

## 2021-11-01 MED ORDER — LACTATED RINGERS INTRAVENOUS SOLUTION
INTRAVENOUS | Status: DC
Start: 2021-11-01 — End: 2021-11-01

## 2021-11-01 MED ORDER — LACTATED RINGERS IV BOLUS
1000.0000 mL | INJECTION | Status: AC
Start: 2021-11-01 — End: 2021-11-01
  Administered 2021-11-01: 1000 mL via INTRAVENOUS

## 2021-11-01 MED ORDER — LORAZEPAM 2 MG/ML INJECTION SYRINGE
INJECTION | INTRAMUSCULAR | Status: AC
Start: 2021-11-01 — End: 2021-11-01
  Filled 2021-11-01: qty 2

## 2021-11-01 MED ORDER — DIPHENHYDRAMINE 50 MG/ML INJECTION SOLUTION
50.0000 mg | INTRAMUSCULAR | Status: DC
Start: 2021-11-01 — End: 2021-11-01

## 2021-11-01 MED ORDER — HEPARIN (PORCINE) 5,000 UNIT/ML INJECTION SOLUTION
5000.0000 [IU] | Freq: Three times a day (TID) | INTRAMUSCULAR | Status: DC
Start: 2021-11-01 — End: 2021-11-01

## 2021-11-01 MED ORDER — DIPHENHYDRAMINE 50 MG/ML INJECTION SOLUTION
INTRAMUSCULAR | Status: AC
Start: 2021-11-01 — End: 2021-11-01
  Filled 2021-11-01: qty 1

## 2021-11-01 MED ORDER — ZIPRASIDONE 20 MG/ML (FINAL CONCENTRATION) INTRAMUSCULAR SOLUTION
INTRAMUSCULAR | Status: AC
Start: 2021-11-01 — End: 2021-11-01
  Filled 2021-11-01: qty 2

## 2021-11-01 MED ORDER — DIPHENHYDRAMINE 50 MG/ML INJECTION SOLUTION
50.0000 mg | INTRAMUSCULAR | Status: AC
Start: 2021-11-01 — End: 2021-11-01
  Administered 2021-11-01: 50 mg via INTRAMUSCULAR

## 2021-11-01 MED ORDER — LORAZEPAM 2 MG/ML INJECTION WRAPPER
3.0000 mg | INTRAMUSCULAR | Status: AC
Start: 2021-11-01 — End: 2021-11-01
  Administered 2021-11-01: 3 mg via INTRAMUSCULAR

## 2021-11-01 MED ORDER — SODIUM CHLORIDE 0.9 % INTRAVENOUS SOLUTION
INTRAVENOUS | Status: DC
Start: 2021-11-01 — End: 2021-11-01

## 2021-11-01 MED ORDER — SODIUM CHLORIDE 0.9 % INTRAVENOUS PIGGYBACK
2.0000 g | INTRAVENOUS | Status: DC
Start: 2021-11-01 — End: 2021-11-01

## 2021-11-01 MED ORDER — ZIPRASIDONE 20 MG/ML (FINAL CONCENTRATION) INTRAMUSCULAR SOLUTION
20.0000 mg | Freq: Once | INTRAMUSCULAR | Status: AC
Start: 2021-11-01 — End: 2021-11-01
  Administered 2021-11-01: 20 mg via INTRAMUSCULAR

## 2021-11-01 NOTE — ED Nurses Note (Signed)
Patient states he has a "low voice" when asked if he always has garbled speech. When patient speaks up able to understand.

## 2021-11-01 NOTE — ED Attending Note (Signed)
[facility] emergency department         HISTORY OF PRESENT ILLNESS     Date:  11/01/2021  Patient's Name:  Albert Ward  Date of Birth:  [birth year]    The patient is a 51 year old with history of a known polysubstance abuse brought to the emergency room by his son who followed him at home agitated twitching trauma LEs.  He was given Narcan by his son at home without response.  Patient admits to taking methamphetamine he is having auditory and visual hallucinations.  Patient was medically sedated with Geodon in the emergency room          Review of Systems     Review of Systems   Constitutional: Negative.    HENT: Negative.    Eyes: Negative.    Respiratory: Negative.    Cardiovascular: Negative.    Gastrointestinal: Negative.    Genitourinary: Negative.    Musculoskeletal: Negative.    Skin: Negative.    Neurological: Negative.    Psychiatric/Behavioral: Positive for agitation, confusion and hallucinations. The patient is hyperactive.    All other systems reviewed and are negative.      Previous History     Past Medical History:  Past Medical History:   Diagnosis Date   . Drug use        Past Surgical History:  Past Surgical History:   Procedure Laterality Date   . Hx hernia repair         Social History:     Social History     Substance and Sexual Activity   Drug Use Not on file       Family History:  No family history on file.    Medication History:  No current outpatient medications on file.       Allergies:  No Known Allergies    Physical Exam     Vitals:    BP 123/87   Pulse (!) 104   Temp 37.8 C (100 F)   Resp 18   Ht 1.829 m (6')   Wt 77.1 kg (170 lb)   SpO2 96%   BMI 23.06 kg/m           Physical Exam  Vitals and nursing note reviewed.   Constitutional:       General: He is not in acute distress.     Appearance: He is well-developed.   HENT:      Head: Normocephalic and atraumatic.      Mouth/Throat:      Mouth: Mucous membranes are dry.   Eyes:       Conjunctiva/sclera: Conjunctivae normal.   Cardiovascular:      Rate and Rhythm: Normal rate and regular rhythm.      Heart sounds: No murmur heard.  Pulmonary:      Effort: Pulmonary effort is normal. No respiratory distress.      Breath sounds: Normal breath sounds.   Abdominal:      General: Abdomen is flat. Bowel sounds are normal.      Palpations: Abdomen is soft.      Tenderness: There is no abdominal tenderness.   Musculoskeletal:         General: No swelling.      Cervical back: Neck supple.   Skin:     General: Skin is warm and dry.      Capillary Refill: Capillary refill takes less than 2 seconds.   Neurological:  Mental Status: He is alert and oriented to person, place, and time.   Psychiatric:      Comments: Patient shaking tremulous all over.  Patient agitated behavior abnormal         Diagnostic Studies/Treatment     Medications:  Medications Administered in the ED   ziprasidone (GEODON) IM injection (20 mg IntraMUSCULAR Given 11/01/21 0138)   LR bolus infusion 1,000 mL (1,000 mL Intravenous New Bag/New Syringe 11/01/21 0224)   LORazepam (ATIVAN) 2 mg/mL injection (3 mg IntraMUSCULAR Given 11/01/21 0141)   diphenhydrAMINE (BENADRYL) 50 mg/mL injection (50 mg IntraMUSCULAR Given 11/01/21 0140)   LR bolus infusion 1,000 mL (1,000 mL Intravenous New Bag/New Syringe 11/01/21 0442)   LR bolus infusion 1,000 mL (1,000 mL Intravenous New Bag/New Syringe 11/01/21 0442)       New Prescriptions    No medications on file       Labs:    Results for orders placed or performed during the hospital encounter of 11/01/21 (from the past 12 hour(s))   BASIC METABOLIC PANEL   Result Value Ref Range    SODIUM 145 136 - 145 mmol/L    POTASSIUM 4.8 3.5 - 5.1 mmol/L    CHLORIDE 104 98 - 107 mmol/L    CO2 TOTAL 17 (L) 21 - 32 mmol/L    ANION GAP 24 (H) 10 - 20 mmol/L    CALCIUM 8.7 8.5 - 10.1 mg/dL    GLUCOSE 123 (H) 74 - 106 mg/dL    BUN 65 (H) 7 - 18 mg/dL    CREATININE 3.67 (H) 0.70 - 1.30 mg/dL    BUN/CREA RATIO 18      ESTIMATED GFR 19 (L) >59 mL/min/1.5m^2    OSMOLALITY, CALCULATED 309 (H) 270 - 290 mOsm/kg   MAGNESIUM   Result Value Ref Range    MAGNESIUM 2.0 1.8 - 2.4 mg/dL   CBC WITH DIFF   Result Value Ref Range    WBCS UNCORRECTED 22.3 x10^3/uL    WBC 22.3 (H) 4.0 - 10.5 x10^3/uL    RBC 4.32 4.20 - 6.00 x10^6/uL    HGB 13.0 (L) 13.5 - 18.0 g/dL    HCT 37.6 (L) 42.0 - 51.0 %    MCV 86.9 78.0 - 99.0 fL    MCH 30.1 27.0 - 32.0 pg    MCHC 34.6 32.0 - 36.0 g/dL    RDW 13.5 11.6 - 14.8 %    PLATELETS 304 140 - 440 x10^3/uL    MPV 8.0 7.4 - 10.4 fL    NEUTROPHIL % 86 (H) 40 - 76 %    LYMPHOCYTE % 4 (L) 25 - 45 %    MONOCYTE % 9 0 - 12 %    EOSINOPHIL % 0 0 - 7 %    BASOPHIL % 0 0 - 3 %    NEUTROPHIL # 19.30 (H) 1.80 - 8.40 x10^3/uL    LYMPHOCYTE # 0.90 (L) 1.10 - 5.00 x10^3/uL    MONOCYTE # 2.10 (H) 0.00 - 1.30 x10^3/uL    EOSINOPHIL # 0.00 0.00 - 0.80 x10^3/uL    BASOPHIL # 0.00 0.00 - 0.30 x10^3/uL   ACETAMINOPHEN LEVEL   Result Value Ref Range    ACETAMINOPHEN LEVEL 0    SALICYLATE ACID LEVEL   Result Value Ref Range    SALICYLATE LEVEL 2 (L) 3 - 20 mg/dL   ETHANOL, SERUM   Result Value Ref Range    ETHANOL <3 <=3 mg/dL   URINE DRUG SCREEN  Result Value Ref Range    AMPHETAMINES URINE Positive (A) Negative    BARBITURATES URINE Negative Negative    BENZODIAZEPINES URINE Negative Negative    METHADONE URINE Negative Negative    COCAINE METABOLITES URINE Negative Negative    OPIATES URINE Negative Negative    PCP URINE Negative Negative    CANNABINOIDS URINE Positive (A) Negative   URINALYSIS, MACRO/MICRO   Result Value Ref Range    COLOR Yellow Yellow, Colorless, Light Yellow, Dark Yellow    APPEARANCE Clear Clear    SPECIFIC GRAVITY >=1.030 1.003 - 1.035    PH 5.5 4.6 - 8.0    LEUKOCYTES Negative Negative WBCs/uL    NITRITE Negative Negative    PROTEIN 100 (A) Negative mg/dL    GLUCOSE Negative Negative mg/dL    KETONES Negative Negative mg/dL    BILIRUBIN Small (A) Negative mg/dL    BLOOD Large (A) Negative mg/dL     UROBILINOGEN 0.2 0.2 - 1.0 mg/dL   URINALYSIS, MICROSCOPIC   Result Value Ref Range    RBCS 16-20 (A) Not Present, 0-3 /hpf    BACTERIA Moderate (A) Occassional of less /hpf /hpf    MUCOUS Moderate (A) (none) /hpf    SPERMATOZOA URINE Rare (A) (none) /hpf    WBCS 0-5 Not Present, Occasional, 0-5 /hpf    SQUAMOUS EPITHELIAL Many (A) Not Present, Few /hpf        Radiology:  None    No orders to display       ECG:  NONE            Differential diagnosis    Polysubstance abuse amphetamine abuse  Course/Disposition/Plan     Course:     ED Course as of 11/01/21 1226   Sun Nov 01, 2021   0846 Patient awaiting Surrogate from his father for [facility] admission.  Medically cleared overnight.    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  Will continue IV fluids with lactated Ringer's, call Jaci Standard for admission.   [place], sleeping, awakens easily, no complaints of shortness of breath, chest pain, pressure or nausea vomiting.  Lungs are clear symmetrical distant, heart regular rhythm without murmur gallop, abdomen soft normal bowel sounds nontender extremities.   1114 Patient discussed with Drue Stager, PA for the hospital service, bed request placed for this patient's admission.   1225 Patient's labs reviewed, BUN creatinine are now 73 and 2.01, initial presentation BUN creatinine were 65 and 3.67.     Patient positive for methamphetamine received IV fluids required sedation with.  On  Disposition:    Data Unavailable    Condition at Disposition:        Follow up:   No follow-up provider specified.    Clinical Impression:     Clinical Impression   Polysubstance abuse (CMS HCC) (Primary)   Methamphetamine abuse (CMS HCC)         Winfred Burn, MD

## 2021-11-01 NOTE — ED Nurses Note (Signed)
EMS loaded patient in ambulance Patient states he was missing money and wallet. Called Albert Ward, son, and he said that patient did not have money or wallet on him last night. EMS brought patient back in ER to talk to Son. Patient decided to sign out AMA. Patient informed of problem with kidneys and states no one cared if he lived or died. AMA papers signed and patient ambulated off unit.

## 2021-11-01 NOTE — ED Nurses Note (Signed)
Patient transported to [facility] room 206 by [facility]. IVF changed to NS for transport. IV site clear.  Ok'd by Dr. Riki Sheer. Personal belongings taken. Report given to [facility].

## 2021-11-01 NOTE — ED Nurses Note (Signed)
Attempting to get oob. Patient incontinent of urine. Ambulated to bathroom with one person assist. Gait unsteady. New scrub pants given. Assisted back to bed. VS obtain

## 2021-11-01 NOTE — ED Triage Notes (Signed)
Patient's son states that he came home and found the patient slumped on the couch. Son reports that the patient woke up and started "flopping in the floor". On arrival, patient is restless and has unpredictable, exaggerated movements. Patient has a hx of drug abuse. Reports that he took "oxy". Son gave narcan at home with no change.

## 2021-11-03 LAB — URINE CULTURE,ROUTINE: URINE CULTURE: NO GROWTH

## 2021-11-19 DIAGNOSIS — R7989 Other specified abnormal findings of blood chemistry: Secondary | ICD-10-CM | POA: Diagnosis not present

## 2021-11-19 DIAGNOSIS — R946 Abnormal results of thyroid function studies: Secondary | ICD-10-CM | POA: Diagnosis not present

## 2021-11-27 DIAGNOSIS — L570 Actinic keratosis: Secondary | ICD-10-CM | POA: Diagnosis not present

## 2021-11-27 DIAGNOSIS — D225 Melanocytic nevi of trunk: Secondary | ICD-10-CM | POA: Diagnosis not present

## 2021-11-27 DIAGNOSIS — L578 Other skin changes due to chronic exposure to nonionizing radiation: Secondary | ICD-10-CM | POA: Diagnosis not present

## 2021-11-27 DIAGNOSIS — L814 Other melanin hyperpigmentation: Secondary | ICD-10-CM | POA: Diagnosis not present

## 2021-11-27 DIAGNOSIS — L813 Cafe au lait spots: Secondary | ICD-10-CM | POA: Diagnosis not present

## 2021-12-10 ENCOUNTER — Other Ambulatory Visit (HOSPITAL_COMMUNITY): Payer: Self-pay

## 2021-12-24 ENCOUNTER — Encounter (HOSPITAL_BASED_OUTPATIENT_CLINIC_OR_DEPARTMENT_OTHER): Payer: Self-pay

## 2021-12-24 ENCOUNTER — Other Ambulatory Visit: Payer: Self-pay

## 2021-12-24 ENCOUNTER — Emergency Department
Admission: EM | Admit: 2021-12-24 | Discharge: 2021-12-24 | Disposition: A | Discharge status: Home / Self Care | Payer: Medicaid Other | Attending: Family Medicine | Admitting: Family Medicine

## 2021-12-24 DIAGNOSIS — E860 Dehydration: Secondary | ICD-10-CM | POA: Insufficient documentation

## 2021-12-24 DIAGNOSIS — Z6821 Body mass index (BMI) 21.0-21.9, adult: Secondary | ICD-10-CM

## 2021-12-24 DIAGNOSIS — F1910 Other psychoactive substance abuse, uncomplicated: Secondary | ICD-10-CM | POA: Insufficient documentation

## 2021-12-24 LAB — CBC WITH DIFF
BASOPHIL #: 0.06 10*3/uL (ref 0.00–0.30)
BASOPHIL %: 0 % (ref 0–3)
EOSINOPHIL #: 0.07 10*3/uL (ref 0.00–0.80)
EOSINOPHIL %: 0 % (ref 0–7)
HCT: 38.3 % — ABNORMAL LOW (ref 42.0–51.0)
HGB: 13.1 g/dL — ABNORMAL LOW (ref 13.5–18.0)
LYMPHOCYTE #: 1.26 10*3/uL (ref 1.10–5.00)
LYMPHOCYTE %: 7 % — ABNORMAL LOW (ref 25–45)
MCH: 29.5 pg (ref 27.0–32.0)
MCHC: 34.2 g/dL (ref 32.0–36.0)
MCV: 86.3 fL (ref 78.0–99.0)
MONOCYTE #: 2.15 10*3/uL — ABNORMAL HIGH (ref 0.00–1.30)
MONOCYTE %: 13 % — ABNORMAL HIGH (ref 0–12)
MPV: 8.1 fL (ref 7.4–10.4)
NEUTROPHIL #: 13.61 10*3/uL — ABNORMAL HIGH (ref 1.80–8.40)
NEUTROPHIL %: 79 % — ABNORMAL HIGH (ref 40–76)
PLATELETS: 264 10*3/uL (ref 140–440)
RBC: 4.44 10*6/uL (ref 4.20–6.00)
RDW: 15.4 % — ABNORMAL HIGH (ref 11.6–14.8)
WBC: 17.2 10*3/uL — ABNORMAL HIGH (ref 4.0–10.5)

## 2021-12-24 LAB — URINALYSIS, MACRO/MICRO
BILIRUBIN: NEGATIVE mg/dL
GLUCOSE: NEGATIVE mg/dL
KETONES: NEGATIVE mg/dL
LEUKOCYTES: NEGATIVE WBCs/uL
NITRITE: NEGATIVE
PH: 6 (ref 4.6–8.0)
PROTEIN: 100 mg/dL — AB
SPECIFIC GRAVITY: >=1.03 (ref 1.003–1.035)
UROBILINOGEN: 0.2 mg/dL (ref 0.2–1.0)

## 2021-12-24 LAB — URINE DRUG SCREEN
AMPHETAMINES URINE: POSITIVE — AB
BARBITURATES URINE: NEGATIVE
BENZODIAZEPINES URINE: NEGATIVE
CANNABINOIDS URINE: POSITIVE — AB
COCAINE METABOLITES URINE: POSITIVE — AB
METHADONE URINE: NEGATIVE
OPIATES URINE: NEGATIVE
PCP URINE: NEGATIVE

## 2021-12-24 LAB — COMPREHENSIVE METABOLIC PANEL, NON-FASTING
ALBUMIN/GLOBULIN RATIO: 1 (ref 0.8–1.4)
ALBUMIN: 3.9 g/dL (ref 3.4–5.0)
ALKALINE PHOSPHATASE: 80 U/L (ref 46–116)
ALT (SGPT): 109 U/L — ABNORMAL HIGH (ref <=78)
ANION GAP: 11 mmol/L (ref 10–20)
AST (SGOT): 100 U/L — ABNORMAL HIGH (ref 15–37)
BILIRUBIN TOTAL: 0.7 mg/dL (ref 0.2–1.0)
BUN/CREA RATIO: 18
BUN: 28 mg/dL — ABNORMAL HIGH (ref 7–18)
CALCIUM, CORRECTED: 9.3 mg/dL
CALCIUM: 9.2 mg/dL (ref 8.5–10.1)
CHLORIDE: 106 mmol/L (ref 98–107)
CO2 TOTAL: 24 mmol/L (ref 21–32)
CREATININE: 1.55 mg/dL — ABNORMAL HIGH (ref 0.70–1.30)
ESTIMATED GFR: 54 mL/min/{1.73_m2} — ABNORMAL LOW (ref >59)
GLOBULIN: 3.9
GLUCOSE: 141 mg/dL — ABNORMAL HIGH (ref 74–106)
OSMOLALITY, CALCULATED: 289 mOsm/kg (ref 270–290)
POTASSIUM: 4.2 mmol/L (ref 3.5–5.1)
PROTEIN TOTAL: 7.8 g/dL (ref 6.4–8.2)
SODIUM: 141 mmol/L (ref 136–145)

## 2021-12-24 LAB — URINALYSIS, MICROSCOPIC
GRANULAR CASTS: 1 /lpf (ref <=1)
HYALINE CASTS: 3 /lpf — ABNORMAL HIGH (ref <0)

## 2021-12-24 MED ORDER — SODIUM CHLORIDE 0.9 % (FLUSH) INJECTION SYRINGE
3.0000 mL | INJECTION | INTRAMUSCULAR | Status: DC | PRN
Start: 2021-12-24 — End: 2021-12-24

## 2021-12-24 MED ORDER — SODIUM CHLORIDE 0.9 % IV BOLUS
1000.0000 mL | INJECTION | Status: AC
Start: 2021-12-24 — End: 2021-12-24
  Administered 2021-12-24: 1000 mL via INTRAVENOUS
  Administered 2021-12-24: 0 mL via INTRAVENOUS

## 2021-12-24 MED ORDER — SODIUM CHLORIDE 0.9 % (FLUSH) INJECTION SYRINGE
3.0000 mL | INJECTION | Freq: Three times a day (TID) | INTRAMUSCULAR | Status: DC
Start: 2021-12-24 — End: 2021-12-24
  Administered 2021-12-24: 0 mL

## 2021-12-24 NOTE — ED Nurses Note (Signed)
In room with patient. Pt states, "I'm so thirsty. I feel like I have heat exhaustion or dehydration." States that he has been working outside all day and just "over did it." Pt is twitching and appears restless. Provider in room.

## 2021-12-24 NOTE — Discharge Instructions (Signed)
Thank you for allowing us to be part of your care.    Please discuss all medications with her pharmacist.  You may have received sedating medications during your visit, please discuss this with your discharging nurse as you may not be able to drive or operate machinery with this medication in your system.  If you feel your situation worsens or does not get better please see a physician for re-evaluation.

## 2021-12-24 NOTE — ED Provider Notes (Signed)
[facility], [facility] Emergency Department  ED Primary Provider Note  History of Present Illness   Chief Complaint   Patient presents with   . Back Pain   . Dehydration     Albert Ward is a 51 y.o. male who had concerns including Back Pain and Dehydration.  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  But states he worked until became extremely sweaty than became dry and able to sweat anymore.  Patient reports extreme dry mouth.  Patient also noted to be extremely restless and mumbling incomprehensible words at times.        Review of Systems   Pertinent positive and negative ROS as per HPI.  Historical Data   History Reviewed This Encounter: Medical History  Surgical History  Family History  Social History      Physical Exam   ED Triage Vitals [12/24/21 1841]   BP (Non-Invasive) (!) 189/97   Heart Rate (!) 124   Respiratory Rate 18   Temperature 35.8 C (96.4 F)   SpO2 93 %   Weight 72.6 kg (160 lb)   Height 1.829 m (6')     Physical Exam   Const: No acute distress, active  HENT:  Normocephalic, left tympanic membrane normal, right tympanic membrane normal, external nose normal nares normal, face is symmetrical, Throat/ posterior oral pharynx normal, mouth pink and moist  EYES:  Pupils perrl  Neck:  Normal visual inspection, full range of motion, no lymphadenopathy  Chest: Normal visual inspection, no tenderness  Resp:  Normal respiratory effort, no audible wheezes and no cough, clear to auscultation bilaterally  Cardio:  In regular rate and rhythm, S1 noted  GI:  Normal visual inspection soft to palpation  Musculoskeletal:  No CVA tenderness, normal range of motion  Derm:  No rashes or lesions noted, turgor normal.  No obvious wound noted  Neuro:  Patient awake oriented x3 moves all extremities  Psych:  Mental status is normal,  speech and movement erratic, anxious  Exam Notes:   Patient Data     Labs Ordered/Reviewed   COMPREHENSIVE METABOLIC PANEL, NON-FASTING - Abnormal; Notable for the following components:       Result Value    BUN 28 (*)     CREATININE 1.55 (*)     ESTIMATED GFR 54 (*)     GLUCOSE 141 (*)     ALT (SGPT) 109 (*)     AST (SGOT) 100 (*)     All other components within normal limits    Narrative:     Estimated Glomerular Filtration Rate (eGFR) is calculated using the CKD-EPI (2021) equation, intended for patients 68 years of age and older. If gender is not documented or "unknown", there will be no eGFR calculation.   URINE DRUG SCREEN - Abnormal; Notable for the following components:    AMPHETAMINES URINE Positive (*)     COCAINE METABOLITES URINE Positive (*)     CANNABINOIDS URINE Positive (*)     All other components within normal limits   CBC WITH DIFF - Abnormal; Notable for the following components:    WBC 17.2 (*)     HGB 13.1 (*)     HCT 38.3 (*)     RDW 15.4 (*)     NEUTROPHIL % 79 (*)     LYMPHOCYTE %  7 (*)     MONOCYTE % 13 (*)     NEUTROPHIL # 13.61 (*)     MONOCYTE # 2.15 (*)     All other components within normal limits   URINALYSIS, MACRO/MICRO - Abnormal; Notable for the following components:    PROTEIN 100 (*)     BLOOD Small (*)     All other components within normal limits   URINALYSIS, MICROSCOPIC - Abnormal; Notable for the following components:    RBCS 11-15 (*)     BACTERIA Few (*)     SPERMATOZOA URINE Few (*)     HYALINE CASTS 3 (*)     WBCS 6-10 (*)     All other components within normal limits   URINE CULTURE,ROUTINE   CBC/DIFF    Narrative:     The following orders were created for panel order CBC/DIFF.  Procedure                               Abnormality         Status                     ---------                               -----------         ------                     CBC WITH XHBZ[[id number]]                Abnormal            Final result                 Please view results for these  tests on the individual orders.   URINALYSIS WITH REFLEX MICROSCOPIC AND CULTURE IF POSITIVE    Narrative:     The following orders were created for panel order URINALYSIS WITH REFLEX MICROSCOPIC AND CULTURE IF POSITIVE.  Procedure                               Abnormality         Status                     ---------                               -----------         ------                     URINALYSIS, MACRO/MICRO[[id number]]      Abnormal            Final result               URINALYSIS, MICROSCOPIC[[id number]]      Abnormal            Final result                 Please view results for these tests on the individual orders.     No orders to display     Medical Decision Making        Medical Decision Making  Patient does  have a white count of 17 do feel this is demargination due to his recent drug use, dehydration an erratic movement in the room.  Patient does report feeling much better after receiving IV bolus while here is able to hold a complete conversation without rambling or whispering.  Patient noted to test positive for cocaine amphetamines and marijuana.    Amount and/or Complexity of Data Reviewed  Labs: ordered.  ECG/medicine tests: independent interpretation performed.      Risk  Prescription drug management.        ED Course as of 12/24/21 2053   Thu Dec 24, 2021   2004 WBC(!): 17.2   2004 Patient does state he uses drugs at    2005 Patient states he does use street drugs at parties and consumes etoh.         Medications Administered in the ED   NS flush syringe (has no administration in time range)   NS flush syringe (has no administration in time range)   NS bolus infusion 1,000 mL (1,000 mL Intravenous New Bag/New Syringe 12/24/21 1934)     Clinical Impression   Dehydration (Primary)   Polysubstance abuse (CMS HCC)       Disposition: Discharged

## 2021-12-24 NOTE — ED Triage Notes (Signed)
Patient reports bilateral flank pain, no urination since this morning. "Im either suffering from heat exhaustion or my kidneys are shutting down." Patient appears restless, rambling, twitching, and whispering to himself in triage. No PO fluids through the day today- per patient.

## 2021-12-24 NOTE — ED Nurses Note (Signed)
Pt states that he is "feling better." No distress noted at this time. Resp even and non labored.

## 2021-12-27 LAB — URINE CULTURE,ROUTINE: URINE CULTURE: NO GROWTH

## 2022-01-12 DIAGNOSIS — Z125 Encounter for screening for malignant neoplasm of prostate: Secondary | ICD-10-CM | POA: Diagnosis not present

## 2022-01-12 DIAGNOSIS — Z0000 Encounter for general adult medical examination without abnormal findings: Secondary | ICD-10-CM | POA: Diagnosis not present

## 2022-01-19 ENCOUNTER — Other Ambulatory Visit: Payer: Self-pay

## 2022-01-19 ENCOUNTER — Emergency Department
Admission: EM | Admit: 2022-01-19 | Discharge: 2022-01-20 | Disposition: A | Discharge status: Other Acute Inpatient Hospital | Payer: Medicaid Other | Attending: Emergency Medicine | Admitting: Emergency Medicine

## 2022-01-19 ENCOUNTER — Encounter (HOSPITAL_BASED_OUTPATIENT_CLINIC_OR_DEPARTMENT_OTHER): Payer: Self-pay

## 2022-01-19 DIAGNOSIS — E860 Dehydration: Secondary | ICD-10-CM

## 2022-01-19 DIAGNOSIS — F1910 Other psychoactive substance abuse, uncomplicated: Secondary | ICD-10-CM

## 2022-01-19 DIAGNOSIS — E162 Hypoglycemia, unspecified: Secondary | ICD-10-CM

## 2022-01-19 DIAGNOSIS — Z5900 Homelessness unspecified: Secondary | ICD-10-CM

## 2022-01-19 DIAGNOSIS — F1510 Other stimulant abuse, uncomplicated: Secondary | ICD-10-CM

## 2022-01-19 DIAGNOSIS — Z87448 Personal history of other diseases of urinary system: Secondary | ICD-10-CM | POA: Insufficient documentation

## 2022-01-19 DIAGNOSIS — R319 Hematuria, unspecified: Secondary | ICD-10-CM | POA: Insufficient documentation

## 2022-01-19 DIAGNOSIS — R001 Bradycardia, unspecified: Secondary | ICD-10-CM | POA: Insufficient documentation

## 2022-01-19 DIAGNOSIS — Z6821 Body mass index (BMI) 21.0-21.9, adult: Secondary | ICD-10-CM

## 2022-01-19 DIAGNOSIS — R4182 Altered mental status, unspecified: Secondary | ICD-10-CM | POA: Insufficient documentation

## 2022-01-19 DIAGNOSIS — R000 Tachycardia, unspecified: Secondary | ICD-10-CM | POA: Insufficient documentation

## 2022-01-19 MED ORDER — DEXTROSE 5 % IN WATER (D5W) INTRAVENOUS SOLUTION
INTRAVENOUS | Status: DC
Start: 2022-01-20 — End: 2022-01-20
  Administered 2022-01-20 (×2): 0 g via INTRAVENOUS

## 2022-01-19 MED ORDER — DEXTROSE 50 % IN WATER (D50W) INTRAVENOUS SYRINGE
25.0000 g | INJECTION | INTRAVENOUS | Status: AC
Start: 2022-01-20 — End: 2022-01-19
  Administered 2022-01-19: 50 mL via INTRAVENOUS

## 2022-01-19 MED ORDER — DEXTROSE 50 % IN WATER (D50W) INTRAVENOUS SYRINGE
25.0000 g | INJECTION | INTRAVENOUS | Status: AC
Start: 2022-01-20 — End: 2022-01-19
  Administered 2022-01-19: 50 mL via INTRAVENOUS
  Filled 2022-01-19: qty 50

## 2022-01-19 NOTE — ED Nurses Note (Signed)
Blood sugar check 46; provider notified

## 2022-01-19 NOTE — ED Triage Notes (Signed)
EMS reports found in ditch, AMS . Pt reports has used meth. Narcan given on scene.

## 2022-01-20 ENCOUNTER — Other Ambulatory Visit: Payer: Self-pay

## 2022-01-20 ENCOUNTER — Encounter (HOSPITAL_COMMUNITY): Payer: Self-pay

## 2022-01-20 ENCOUNTER — Encounter (HOSPITAL_PSYCHIATRIC): Payer: Self-pay

## 2022-01-20 ENCOUNTER — Inpatient Hospital Stay
Admission: RE | Admit: 2022-01-20 | Discharge: 2022-01-23 | DRG: 885 | Discharge status: Against Medical Advice | Payer: Medicaid Other | Source: Other Acute Inpatient Hospital | Attending: Psychiatry | Admitting: Psychiatry

## 2022-01-20 ENCOUNTER — Encounter (HOSPITAL_PSYCHIATRIC): Payer: Self-pay | Admitting: Psychiatry

## 2022-01-20 DIAGNOSIS — F333 Major depressive disorder, recurrent, severe with psychotic symptoms: Principal | ICD-10-CM | POA: Diagnosis present

## 2022-01-20 DIAGNOSIS — F411 Generalized anxiety disorder: Secondary | ICD-10-CM | POA: Diagnosis present

## 2022-01-20 DIAGNOSIS — F17210 Nicotine dependence, cigarettes, uncomplicated: Secondary | ICD-10-CM

## 2022-01-20 DIAGNOSIS — F1510 Other stimulant abuse, uncomplicated: Secondary | ICD-10-CM

## 2022-01-20 DIAGNOSIS — N289 Disorder of kidney and ureter, unspecified: Secondary | ICD-10-CM

## 2022-01-20 DIAGNOSIS — Z6821 Body mass index (BMI) 21.0-21.9, adult: Secondary | ICD-10-CM

## 2022-01-20 DIAGNOSIS — F1994 Other psychoactive substance use, unspecified with psychoactive substance-induced mood disorder: Secondary | ICD-10-CM | POA: Diagnosis present

## 2022-01-20 DIAGNOSIS — I959 Hypotension, unspecified: Secondary | ICD-10-CM | POA: Diagnosis present

## 2022-01-20 DIAGNOSIS — F1123 Opioid dependence with withdrawal: Secondary | ICD-10-CM | POA: Diagnosis present

## 2022-01-20 DIAGNOSIS — R45851 Suicidal ideations: Secondary | ICD-10-CM | POA: Diagnosis present

## 2022-01-20 DIAGNOSIS — F1420 Cocaine dependence, uncomplicated: Secondary | ICD-10-CM | POA: Diagnosis present

## 2022-01-20 DIAGNOSIS — Z91199 Patient's noncompliance with other medical treatment and regimen due to unspecified reason: Secondary | ICD-10-CM

## 2022-01-20 DIAGNOSIS — I10 Essential (primary) hypertension: Secondary | ICD-10-CM | POA: Diagnosis present

## 2022-01-20 DIAGNOSIS — Z560 Unemployment, unspecified: Secondary | ICD-10-CM

## 2022-01-20 DIAGNOSIS — G40909 Epilepsy, unspecified, not intractable, without status epilepticus: Secondary | ICD-10-CM | POA: Diagnosis present

## 2022-01-20 DIAGNOSIS — E860 Dehydration: Secondary | ICD-10-CM | POA: Diagnosis present

## 2022-01-20 DIAGNOSIS — F1220 Cannabis dependence, uncomplicated: Secondary | ICD-10-CM | POA: Diagnosis present

## 2022-01-20 DIAGNOSIS — F23 Brief psychotic disorder: Secondary | ICD-10-CM | POA: Diagnosis present

## 2022-01-20 LAB — ECG 12 LEAD
Atrial Rate: 53 {beats}/min
Calculated P Axis: 74 degrees
Calculated R Axis: 21 degrees
Calculated T Axis: 44 degrees
PR Interval: 162 ms
QRS Duration: 86 ms
QT Interval: 438 ms
QTC Calculation: 410 ms
Ventricular rate: 53 {beats}/min

## 2022-01-20 LAB — COMPREHENSIVE METABOLIC PANEL, NON-FASTING
ALBUMIN/GLOBULIN RATIO: 0.9 (ref 0.8–1.4)
ALBUMIN: 3.6 g/dL (ref 3.4–5.0)
ALKALINE PHOSPHATASE: 81 U/L (ref 46–116)
ALT (SGPT): 85 U/L — ABNORMAL HIGH (ref <=78)
ANION GAP: 9 mmol/L — ABNORMAL LOW (ref 10–20)
AST (SGOT): 67 U/L — ABNORMAL HIGH (ref 15–37)
BILIRUBIN TOTAL: 0.5 mg/dL (ref 0.2–1.0)
BUN/CREA RATIO: 19
BUN: 26 mg/dL — ABNORMAL HIGH (ref 7–18)
CALCIUM, CORRECTED: 8.9 mg/dL
CALCIUM: 8.5 mg/dL (ref 8.5–10.1)
CHLORIDE: 110 mmol/L — ABNORMAL HIGH (ref 98–107)
CO2 TOTAL: 24 mmol/L (ref 21–32)
CREATININE: 1.34 mg/dL — ABNORMAL HIGH (ref 0.70–1.30)
ESTIMATED GFR: 65 mL/min/{1.73_m2} (ref >59)
GLOBULIN: 3.9
GLUCOSE: 105 mg/dL (ref 74–106)
OSMOLALITY, CALCULATED: 290 mOsm/kg (ref 270–290)
POTASSIUM: 3.8 mmol/L (ref 3.5–5.1)
PROTEIN TOTAL: 7.5 g/dL (ref 6.4–8.2)
SODIUM: 143 mmol/L (ref 136–145)

## 2022-01-20 LAB — CBC WITH DIFF
BASOPHIL #: 0.04 10*3/uL (ref 0.00–0.30)
BASOPHIL %: 0 % (ref 0–3)
EOSINOPHIL #: 0.06 10*3/uL (ref 0.00–0.80)
EOSINOPHIL %: 0 % (ref 0–7)
HCT: 38.2 % — ABNORMAL LOW (ref 42.0–51.0)
HGB: 13.1 g/dL — ABNORMAL LOW (ref 13.5–18.0)
LYMPHOCYTE #: 1.12 10*3/uL (ref 1.10–5.00)
LYMPHOCYTE %: 9 % — ABNORMAL LOW (ref 25–45)
MCH: 29.3 pg (ref 27.0–32.0)
MCHC: 34.3 g/dL (ref 32.0–36.0)
MCV: 85.6 fL (ref 78.0–99.0)
MONOCYTE #: 1.46 10*3/uL — ABNORMAL HIGH (ref 0.00–1.30)
MONOCYTE %: 12 % (ref 0–12)
MPV: 7.8 fL (ref 7.4–10.4)
NEUTROPHIL #: 9.86 10*3/uL — ABNORMAL HIGH (ref 1.80–8.40)
NEUTROPHIL %: 79 % — ABNORMAL HIGH (ref 40–76)
PLATELETS: 245 10*3/uL (ref 140–440)
RBC: 4.46 10*6/uL (ref 4.20–6.00)
RDW: 15.7 % — ABNORMAL HIGH (ref 11.6–14.8)
WBC: 12.5 10*3/uL — ABNORMAL HIGH (ref 4.0–10.5)

## 2022-01-20 LAB — URINE DRUG SCREEN
AMPHETAMINES URINE: POSITIVE — AB
BARBITURATES URINE: NEGATIVE
BENZODIAZEPINES URINE: NEGATIVE
CANNABINOIDS URINE: POSITIVE — AB
COCAINE METABOLITES URINE: POSITIVE — AB
METHADONE URINE: NEGATIVE
OPIATES URINE: NEGATIVE
PCP URINE: NEGATIVE

## 2022-01-20 LAB — URINALYSIS, MACRO/MICRO
BILIRUBIN: NEGATIVE mg/dL
GLUCOSE: 100 mg/dL — AB
KETONES: NEGATIVE mg/dL
LEUKOCYTES: NEGATIVE WBCs/uL
NITRITE: NEGATIVE
PH: 6 (ref 4.6–8.0)
PROTEIN: 100 mg/dL — AB
SPECIFIC GRAVITY: >=1.03 (ref 1.003–1.035)
UROBILINOGEN: 0.2 mg/dL (ref 0.2–1.0)

## 2022-01-20 LAB — URINALYSIS, MICROSCOPIC

## 2022-01-20 LAB — CREATINE KINASE (CK), TOTAL, SERUM: CREATINE KINASE: 500 U/L — ABNORMAL HIGH (ref 39–308)

## 2022-01-20 LAB — ACETAMINOPHEN LEVEL: ACETAMINOPHEN LEVEL: 0

## 2022-01-20 LAB — ETHANOL, SERUM/PLASMA: ETHANOL: <3 mg/dL (ref <=3)

## 2022-01-20 LAB — SALICYLATE ACID LEVEL: SALICYLATE LEVEL: <3 mg/dL — ABNORMAL LOW (ref 3–20)

## 2022-01-20 MED ORDER — MAGNESIUM HYDROXIDE 400 MG/5 ML ORAL SUSPENSION
30.0000 mL | Freq: Every evening | ORAL | Status: DC | PRN
Start: 2022-01-20 — End: 2022-01-23

## 2022-01-20 MED ORDER — ACETAMINOPHEN 325 MG TABLET
650.0000 mg | ORAL_TABLET | ORAL | Status: DC | PRN
Start: 2022-01-20 — End: 2022-01-23

## 2022-01-20 MED ORDER — ALUMINUM-MAG HYDROXIDE-SIMETHICONE 200 MG-200 MG-20 MG/5 ML ORAL SUSP
30.0000 mL | ORAL | Status: DC | PRN
Start: 2022-01-20 — End: 2022-01-23

## 2022-01-20 MED ORDER — NICOTINE 21 MG/24 HR DAILY TRANSDERMAL PATCH
21.0000 mg | MEDICATED_PATCH | Freq: Every day | TRANSDERMAL | Status: DC
Start: 2022-01-20 — End: 2022-01-23
  Administered 2022-01-20 – 2022-01-23 (×4): 21 mg via TRANSDERMAL
  Filled 2022-01-20 (×4): qty 1

## 2022-01-20 MED ORDER — LORAZEPAM 2 MG/ML INJECTION WRAPPER
2.0000 mg | Freq: Four times a day (QID) | INTRAMUSCULAR | Status: DC | PRN
Start: 2022-01-20 — End: 2022-01-23

## 2022-01-20 MED ORDER — ZIPRASIDONE 20 MG/ML (FINAL CONCENTRATION) INTRAMUSCULAR SOLUTION
20.0000 mg | Freq: Two times a day (BID) | INTRAMUSCULAR | Status: DC | PRN
Start: 2022-01-20 — End: 2022-01-23

## 2022-01-20 MED ORDER — TRAZODONE 50 MG TABLET
50.0000 mg | ORAL_TABLET | Freq: Every evening | ORAL | Status: DC | PRN
Start: 2022-01-20 — End: 2022-01-23
  Administered 2022-01-20 – 2022-01-21 (×2): 50 mg via ORAL
  Filled 2022-01-20 (×3): qty 1

## 2022-01-20 MED ORDER — HYDROXYZINE PAMOATE 25 MG CAPSULE
25.0000 mg | ORAL_CAPSULE | Freq: Three times a day (TID) | ORAL | Status: DC | PRN
Start: 2022-01-20 — End: 2022-01-23
  Administered 2022-01-20 – 2022-01-22 (×4): 25 mg via ORAL
  Filled 2022-01-20 (×4): qty 1

## 2022-01-20 MED ORDER — NALOXONE 4 MG/ACTUATION NASAL SPRAY
1.0000 | NASAL | 2 refills | Status: DC | PRN
Start: 2022-01-20 — End: 2023-01-25

## 2022-01-20 MED ORDER — CLONIDINE HCL 0.1 MG TABLET
0.1000 mg | ORAL_TABLET | ORAL | Status: AC | PRN
Start: 2022-01-20 — End: 2022-01-23
  Administered 2022-01-20 – 2022-01-23 (×4): 0.1 mg via ORAL
  Filled 2022-01-20 (×4): qty 1

## 2022-01-20 NOTE — ED Nurses Note (Signed)
Pt resting in room with eyes closed at this time. Resp even and non labored. No distress noted.

## 2022-01-20 NOTE — Group Note (Signed)
Group topic:  PROCESS THERAPY    Date of group:  01/20/2022  Start time of group:  1010  End time of group:  1100               Summary of group discussion: What can make your treatment successful.      Did not attend group    Lendon Colonel, [place]  01/20/2022, 11:16

## 2022-01-20 NOTE — ED Nurses Note (Signed)
Transferred to the [facility] by [employer]. Personal belongings taken. No problems noted upon leaving unit.

## 2022-01-20 NOTE — ED Nurses Note (Signed)
Gave pt sandwich tray and drink per pt request; repositioned for comfort

## 2022-01-20 NOTE — Discharge Instructions (Signed)
You were found in a ditch and brought to the emergency department.  It is important that you stop using drugs as UR high risk for dying

## 2022-01-20 NOTE — ED Provider Notes (Addendum)
[facility], [facility] Emergency Department  ED Primary Provider Note  HPI:  Nathan Flowers is a 51 y.o. male       Patient found altered in a ditch one-sided [facility].  He was given Narcan with little response altered combative.  He states that he is used meth in it is not process correctly due to kidney problems.     ROS review and negative aside from stated in HPI.    Physical Exam:  ED Triage Vitals [01/19/22 2301]   BP (Non-Invasive) 121/82   Heart Rate (!) 123   Respiratory Rate 14   Temperature 36.1 C (96.9 F)   SpO2 93 %   Weight 68 kg (150 lb)   Height 1.778 m (5' 10")     Patient is clearly intoxicated with a substance.  He is wet covered in dirt.  He is poorly tracking responds only intermittently to questions.  Pupils 2 mm equal round poorly react reactive.   Oropharynx is clear.  Mucous membranes dry.  Trachea midline.  Neck is supple.  Heart has regular rate and rhythm without significant murmurs rubs or gallops.  Lungs are clear to auscultation.  Abdomen soft nontender, nondistended.  Moving all extremities without difficulty.  Multiple sores on neck.    Patient data:  Labs Ordered/Reviewed   COMPREHENSIVE METABOLIC PANEL, NON-FASTING - Abnormal; Notable for the following components:       Result Value    CHLORIDE 110 (*)     ANION GAP 9 (*)     BUN 26 (*)     CREATININE 1.34 (*)     ALT (SGPT) 85 (*)     AST (SGOT) 67 (*)     All other components within normal limits    Narrative:     Estimated Glomerular Filtration Rate (eGFR) is calculated using the CKD-EPI (2021) equation, intended for patients 78 years of age and older. If gender is not documented or "unknown", there will be no eGFR calculation.   CREATINE KINASE (CK), TOTAL, SERUM - Abnormal; Notable for the following components:    CREATINE KINASE 500 (*)     All other components within normal limits   URINE DRUG SCREEN - Abnormal; Notable for the following components:    AMPHETAMINES URINE Positive (*)      COCAINE METABOLITES URINE Positive (*)     CANNABINOIDS URINE Positive (*)     All other components within normal limits   CBC WITH DIFF - Abnormal; Notable for the following components:    WBC 12.5 (*)     HGB 13.1 (*)     HCT 38.2 (*)     RDW 15.7 (*)     NEUTROPHIL % 79 (*)     LYMPHOCYTE % 9 (*)     NEUTROPHIL # 9.86 (*)     MONOCYTE # 1.46 (*)     All other components within normal limits   URINALYSIS, MACRO/MICRO - Abnormal; Notable for the following components:    PROTEIN 100 (*)     GLUCOSE 100 (*)     BLOOD Moderate (*)     All other components within normal limits   URINALYSIS, MICROSCOPIC - Abnormal; Notable for the following components:    RBCS 6-10 (*)     BACTERIA Few (*)     MUCOUS Few (*)     SQUAMOUS EPITHELIAL Several (*)     All other components within normal limits   SALICYLATE ACID LEVEL -  Abnormal; Notable for the following components:    SALICYLATE LEVEL <3 (*)     All other components within normal limits   ETHANOL, SERUM - Normal   ACETAMINOPHEN LEVEL - Normal   URINE CULTURE,ROUTINE   CBC/DIFF    Narrative:     The following orders were created for panel order CBC/DIFF.  Procedure                               Abnormality         Status                     ---------                               -----------         ------                     CBC WITH JMEQ[[id number]]                Abnormal            Final result                 Please view results for these tests on the individual orders.   URINALYSIS WITH REFLEX MICROSCOPIC AND CULTURE IF POSITIVE    Narrative:     The following orders were created for panel order URINALYSIS WITH REFLEX MICROSCOPIC AND CULTURE IF POSITIVE.  Procedure                               Abnormality         Status                     ---------                               -----------         ------                     URINALYSIS, MACRO/MICRO[[id number]]      Abnormal            Final result               URINALYSIS, MICROSCOPIC[[id number]]      Abnormal             Final result                 Please view results for these tests on the individual orders.   URINE DRUG SCREEN     No orders to display     EKG read by me shows sinus bardycardia with rate 53, normal axis, qtc 410. LVH. No st or t wave changes.     MDM:  Altered mental status.  Likely secondary to polysubstance abuse.  Will evaluate for rhabdo dehydration another complications.  He is tachycardic here but maintaining saturations protecting airway.  CPK is 500.  BUN creatinine 26 and 1.34.  He is got some protein glucose and blood in the urine as well as a few bacteria.  She did return positive for amphetamines cocaine and cannabinoids.  Remainder of workup including ethanol and Cialis a unremarkable.  Finger stick glucose was low to approximally 40.  He was given an amp of D50 and soon required a 2nd D50 bolus.  He is put on D5 drip as he was not eating.  Patient was re-evaluated approximately 30 minute intervals.  His mentation gradually improved he was able to eat taken off the drip and maintained glucose.  .  ED Course as of 01/20/22 0729   Wed Jan 20, 2022   0614 Patient resting comfortably in maintaining glucose.  She is eating he is mentating appropriately.  He now states that he wants to seek rehab inpatient .  He states he has been clean for 2 years until he came back to this area in December.  He also notes that he is homeless.   0729 Patient accepted to [facility] Dr. Nolon Rod     Patient medically cleared and awaiting evaluation.  All pertinent details will be signed out to oncoming provider for continued management and final disposition.    Critical Care Time:  Total critical care time spent in direct care of this patient at high risk based on presenting history/exam/and complaint, including initial evaluation and stabilization, review of data, re-examination, discussion with admitting and consulting services to arrange definitive care, and exclusive of any procedures performed, was 30 minutes.        Psych  Facility  Clinical Impression   Polysubstance abuse (CMS HCC) (Primary)   Dehydration   Methamphetamine abuse (CMS HCC)   Hypoglycemia   Homeless     Medications Administered in the ED   D5W premix infusion (0 g Intravenous Paused 01/20/22 0238)   dextrose 50% (0.5 g/mL) injection - syringe (50 mL Intravenous Given 01/19/22 2324)   dextrose 50% (0.5 g/mL) injection - syringe (50 mL Intravenous Given 01/19/22 2352)        Current Discharge Medication List      START taking these medications.      Details   naloxone 4 mg/actuation Spray, Non-Aerosol  Commonly known as: NARCAN   1 Spray, INTRANASAL, EVERY 2 MIN PRN, for actual or suspected opioid overdose. Call 911 if used.  Qty: 2 Each  Refills: 2

## 2022-01-20 NOTE — ED Nurses Note (Signed)
Information given to Megan at the [facility] for possible admission

## 2022-01-20 NOTE — ED Nurses Note (Signed)
EMS brought pt in. Stated that they found him in a ditch. Pt is wet and dirty. Pt admits to taking "meth." Pt advises that he "snorted" it. Pt has sores noted all over his body. Pt was lethargic on arrival. EMS states that pt was narcanned before their arrival with little response. EMS started bilateral IVs. Pt has a 18g to his left forearm and a 20g to his right forearm. EMS started normal saline KVO in the 18g. Pt maintaining his own airway at this time. Resp even and non labored. Breathing is shallow. Pupils reactive at this time.

## 2022-01-20 NOTE — Nurses Notes (Signed)
Nursing admission note 7a-7p. Patient admitted to adult unit. Initial assessment completed. Pt verbalizing "wanting to get help with my addiction". Throughout out assessment patient unable to sit still at times, rambling while other times falling asleep during interview. Patient currently denies SI, HI and reports hearing voices only "when I am high." Oriented to unit and room. Will continue to visually monitor Q15MIN and prn throughout shift by staff.

## 2022-01-20 NOTE — ED Nurses Note (Signed)
Report called to [facility] at the [facility].

## 2022-01-20 NOTE — ED Nurses Note (Signed)
Patient denies SI. States he wants to go to the [facility] to get clean so he can see his granddaughters. Denies ever being SI in the past. Denies HI, "I've never wanted to hurt anybody."

## 2022-01-20 NOTE — ED Nurses Note (Addendum)
Blood sugar check 125; provider notified

## 2022-01-20 NOTE — Behavioral Health (Signed)
[facility]      Biopsychosocial Assessment    Patient: Nathan Flowers, Nathan Flowers, 51 y.o., White, male  Date of Birth:  1971/02/15  MRN: [id number]  Room/Bed: 208/B  Medical Record #: [id number]  Date of Admission: 01/20/2022  Admission Type: Voluntary  Status:  Voluntary    Medical Diagnosis and History:  There is no problem list on file for this patient.    History reviewed. No pertinent past medical history.  Please reference physician H&P.    Past Surgical History:   Procedure Laterality Date   . HX HERNIA REPAIR Left              Date Performed: 01/20/2022    Attending Physician:  Dr. Nolon Rod    Informant: Information was gathered from the patient and clinical record    Chief Complaint / Problems:   Patient is a 51 y.o. English-speaking Caucasian male admitted to the behavior [name] on a voluntary basis presenting with symptoms of hallucinations and paranoia.  Patient is familiar to behavior [facility] having had prior hospitalization in January 2021.  Patient states he is not currently established with any outpatient providers.    Patient states reason of admission:  "Was here in 2021 and went to [facility] for treatment and went to live with my daughter in [place] and had done well for 2 years and then I came back here and got around my buddies.  I relapsed in January and since then has been going downhill had to be Narcan at least 6 or more times and I just can not help myself. "    Records indicate patient was taken to the ER after being found by EMS in a ditch where he was lying wet and dirty.  He admits to snorting meth and has sores all over his body.  He was given Narcan by EMS.  While in the ER he was seeking treatment stating "you know what I will do if I get out here and go to my car. "  Patient was also endorsing visual hallucinations seeing random things and people who are not there.  Patient's urinary drug screen was positive for THC, cocaine and amphetamines.    Severity of Stressors  and Maladaptive Behavior identified:    Patient reports his depression and anxiety both being a 10 out of a 10 point scale with 10 being the most.  Patient reports his sleep has been "not sleeping much lately" and denies having any nightmares. Patient reports his appetite has been "pretty good".    Patient reports to having racing thoughts.  Patient stated to having visual hallucinations of "seeing things and people that are not there".  patient states increase in paranoia due to visual hallucinations believed to be brought on by substance use per patient report.   Patient denied having any panic attacks,SI, HI and confusion.  Patient reports once he graduated [facility] program in 2021 who was not successful in keeping follow-up outpatient.        CURRENT FAMILY SITUATION AND PEER GROUP  The patient currently lives in [place] with his son.  Patient states his network of friends are all substance users with the exception of his son.  Patient reports he was married on 1 occasion the marriage lasted for a period of 11 years before ending in divorce.  Patient reports from this marriage she has a son and a daughter both of adult age his son is with whom  he resides in [facility] on [address] and his daughter resides in [place].  He reports good and supportive relationship with both.  Patient reports his father is deceased.  Patient states his mother is living however is in assisted living facility in [place].  Patient states he has 2 brothers 1 who resides in [place] and other in [place], [place]. he reports he is 2nd in chronological birth order of 3.  Patient reports a good relationship with both brothers.      Sociocultural Environment and Severity of Stress.   [place] reports that hedoes not have a religious preference.  However states he does believe in God.. 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  Military service history is negative. 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  financially he is dependent upon his employment.  He denies receiving food stamps.  patient is a recipient of [place].  He denies gambling.  Past or current legal charges are denied.  patient reports relapse occurred since January 2023 and has been Narcan greater than 6 times.      Tobacco/Chemical Substance History  Patient is positive for tobacco use stating he smokes a pack and half a day.  He was provided educational literature regarding tobacco sensation though declines to enroll this time.  Patient's urinary drug screen was positive for marijuana, cocaine and amphetamine.  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  States he will occasionally mix cocaine once in awhile but is not as frequent.  He does utilize marijuana also frequently if not on a daily basis at times.  He denies any alcohol use.  Discussed with patient was adverse side effects in use with prescription medication.  We also discussed relapse prevention and long-term treatment for substance addiction.  Patient stated after last admission in 2021 he transferred to a treatment program in [place], [place] completing program doing well for 2 years.  He states he knows what is involved but is indecisive on whether or not he will pursue long-term treatment time.  This will be further explored throughout course treatment.    Substance Abuse Treatment History:  Patient reports receiving substance abuse treatment.     Developmental History:  The patient reports that he was born and raised in [place], [place].  The patient reports that her developmental memories of childhood were positive.    Patient states he was raised by his adoptive mother and father.  The patient states his mother and father adopted him as an infant.  Patient states he is  never met his biological parents.  He reports after being adopted his developmental memories are positive.  He denies physical, psychological or sexual abuse. Unusual childhood illnesses and injuries are denied.    Activity Assessment and Discharge Recommendations:  Patient denies having any hobbies or interests due to substance use.  Patient can benefit from activities that improve his mood and affect.  Patient can benefit from activities that provide positive outlets for his feelings and emotions.  Patient can benefit from activities that improve his feelings of self-worth.  Patient can benefit from activities and improve reality base functioning and testing.  Patient can benefit from activities that provide him with alternative forms of self fulfillment/relapse prevention versus continued chemical abuse.     Primary Therapist Assessment:  Patient  appear today as both rational and coherent.  There were no overt signs of patient experiencing any form of psychosis.  Patient is English-speaking and is ambulatory.  Patient's mood was euthymic and his affect was congruent.  Behavior was cooperative.  Appearance was disheveled.  Tone voice was low. Eye contact was good.  Estimate intelligence level is average. Insight and judgment are fair.  Recent remote memory status are fair. There appears to be no evidence of historical psychosis or delusional thinking.  Self-esteem and ego strength status are low.  Strengths are communication and community resources.  Weaknesses are insight, motivation and coping skills.    Plan:  While on the unit, the patient will be involved in individual, group, activity, psychoactive and milieu therapies in order to reduce  symptoms to a manageable, functional level.  Patient denied a family conference at this time.  Wll continue to explore options of long-term substance abuse treatment with patient providing application should interest be conveyed    Discharge Plan:    Mukhtar will return to  home address on file should he not wish to pursue substance abuse treatment.  Outpatient services will be scheduled and patient will be encouraged to participate and follow through with outpatient treatment.    Patient will also be encouraged to attend chemical dependence support groups as needed.     Safety Plan:  Safety plan given to patient to be discussed and completed with counselor prior to discharge from facility.       Transportation:  Upon discharge, [facility] will be transported by either Medicaid taxi and/or [place].

## 2022-01-20 NOTE — ED Nurses Note (Signed)
Pt resting in room at this time.

## 2022-01-20 NOTE — Care Plan (Signed)
Care plan initiated.

## 2022-01-20 NOTE — ED Nurses Note (Signed)
Pt is not SI/HI. Pt advises that he just wants detox. Pt in paper scrubs and belongings in the belonging closet.

## 2022-01-20 NOTE — ED Nurses Note (Signed)
Patient blood sugar 65, provider made aware.

## 2022-01-20 NOTE — ED Nurses Note (Signed)
Blood sugar check @ 89; informed provider

## 2022-01-20 NOTE — Nurses Notes (Signed)
Patient is withdrawn to his room. Compliant with meds. Rates anxiety/depression a 6. Denies SI/ HI/ AVH at this time. Visual checks q 15 minutes and prn.

## 2022-01-20 NOTE — ED Nurses Note (Signed)
[facility] contacted to transport patient to [facility] at this time

## 2022-01-20 NOTE — ED Nurses Note (Signed)
Pt advises that he wants to go to a drug detox program. States "You know what I will do if I get out of here and go to my car." MD notified.

## 2022-01-20 NOTE — Group Note (Signed)
Group topic:  PROCESS THERAPY    Date of group:  01/20/2022  Start time of group:  1300  End time of group:  1400               Summary of group discussion:  The importance and process of change through treat,emt.       Nathan Flowers  is a 51 y.o. male participating in a process group.    DID NOT ATTEND    Comments:    [name], [name]  01/20/2022, 14:36

## 2022-01-20 NOTE — ED Nurses Note (Signed)
Pt awake and drinking coffee and a coke. Advised of discharge status. Verbalized understanding.

## 2022-01-20 NOTE — Consults (Signed)
[facility]  Hospitalist Consultation    Date of Service:  01/20/2022  Nathan Flowers   52 y.o. male  Date of Admission:  01/20/2022  Date of Birth:  November 09, 1970  0         History of Present Illness:  Patient is a 51 year old Caucasian male who is here for psychiatric stabilization.  Patient states he relapsed on drugs after being sober for 2 or 3 years.  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  He states he has no chronic medical problems.  He is no specific physical complaints at this time.  States he is had to be "Narcaned "several times since he has relapsed.  He is wondering if he has kidney disease.  His BUN and creatinine were slightly elevated.  We can repeat this in a few days.                    History:    Past Medical:  History reviewed. No pertinent past medical history.   Past Surgical:    Past Surgical History:   Procedure Laterality Date   . HX HERNIA REPAIR Left       Family:    Family Medical History:    None        Social:   reports that he has been smoking cigarettes. He has a 30.00 pack-year smoking history. He has been exposed to tobacco smoke. He has never used smokeless tobacco. He reports that he does not currently use alcohol. He reports current drug use. Frequency: 5.00 times per week. Drugs: Amphetamine, Cocaine, Marijuana, Methamphetamines, and Heroin.     REVIEW OF SYSTEMS:  Full ROS performed. ROS negative if not mentioned in HPI.    No Known Allergies    Medications:  Medications Prior to Admission     Prescriptions    naloxone (NARCAN) 4 mg per spray nasal spray    1 Spray by INTRANASAL route Every 2 minutes as needed for actual or suspected opioid overdose. Call 911 if used.    Patient not taking:  Reported on 01/20/2022        acetaminophen (TYLENOL) tablet, 650 mg, Oral, Q4H PRN  aluminum-magnesium hydroxide-simethicone (MAG-AL PLUS) 200-200-20 mg per 5 mL oral liquid, 30 mL, Oral, Q4H PRN  cloNIDine (CATAPRES) tablet, 0.1 mg, Oral, Q4H  PRN  magnesium hydroxide (MILK OF MAGNESIA) 400mg  per 2mL oral liquid, 30 mL, Oral, HS PRN  nicotine (NICODERM CQ) transdermal patch (mg/24 hr), 21 mg, Transdermal, Daily  traZODone (DESYREL) tablet, 50 mg, Oral, HS PRN - MR x 1        Physical Exam:  Vitals:  Temperature: 36.2 C (97.1 F)  Heart Rate: 50  Respiratory Rate: 20  BP (Non-Invasive): 133/85  SpO2: 100 %    Exam:  Nursing note and vitals reviewed.   General: No acute distress, alert and oriented x3  HEENT: Pharynx without no erythema, exudate. PERRLA, conjunctivae are without injection or scleral icterus  Neck: Neck supple without lymphadenopathy. Thyroid non-tender and without nodules.  Cardio: Regular rate and rhythm. Normal S1 and S2. No murmurs, gallops, or rubs. PMI located in the midclavicular line.   Resp: Clear to auscultation bilaterally. No wheezes, rales, rhonchi or crackles. Normal resp effort.  Abd: Bowel sounds present in all 4 quadrants. Negative murphy's sign. No rebound tenderness or involuntary guarding. Liver and spleen not palpable.  GU: Deferred exam  Extremities: No edema or swelling noted. No gait disturbances or weakness.  Muscle strength 5/5 in bilateral upper and lower extremities.  Skin: No rashes, ulcers, or bruising.  Neuro: CN II - XII grossly intact  I: smell Not tested   II: visual acuity  Visual acuity appears normal bilaterally   II: visual fields Full to confrontation   II: pupils Equal, round, reactive to light   III,VII: ptosis none   III,IV,VI: extraocular muscles  Full ROM   V: mastication normal   V: facial light touch sensation  normal   V,VII: corneal reflex  present   VII: facial muscle function - upper  normal   VII: facial muscle function - lower normal   VIII: hearing Not tested   IX: soft palate elevation  normal   IX,X: gag reflex present   XI: trapezius strength  5/5   XI: sternocleidomastoid strength 5/5   XI: neck flexion strength  5/5   XII: tongue strength  normal      [facility]  [address]                        Phone [phone]  [place], [place]  [address]      Fax: [phone]        Labs:     Results for orders placed or performed during the hospital encounter of 01/19/22 (from the past 24 hour(s))   URINE DRUG SCREEN   Result Value Ref Range    AMPHETAMINES URINE Positive (A) Negative    BARBITURATES URINE Negative Negative    BENZODIAZEPINES URINE Negative Negative    METHADONE URINE Negative Negative    COCAINE METABOLITES URINE Positive (A) Negative    OPIATES URINE Negative Negative    PCP URINE Negative Negative    CANNABINOIDS URINE Positive (A) Negative   URINALYSIS, MACRO/MICRO   Result Value Ref Range    COLOR Yellow Yellow    APPEARANCE Clear Clear    SPECIFIC GRAVITY >=1.030 1.003 - 1.035    PH 6.0 4.6 - 8.0    LEUKOCYTES Negative Negative WBCs/uL    NITRITE Negative Negative    PROTEIN 100 (A) Negative mg/dL    GLUCOSE 100 (A) Negative mg/dL    KETONES Negative Negative mg/dL    BILIRUBIN Negative Negative mg/dL    BLOOD Moderate (A) Negative mg/dL    UROBILINOGEN 0.2 0.2 - 1.0 mg/dL   URINALYSIS, MICROSCOPIC   Result Value Ref Range    RBCS 6-10 (A) 0-3, Not Present /hpf    BACTERIA Few (A) Negative /hpf    MUCOUS Few (A) (none) /hpf    WBCS Occasional Not Present, Occasional, 0-5 /hpf    SQUAMOUS EPITHELIAL Several (A) Not Present, Few /hpf   COMPREHENSIVE METABOLIC PANEL, NON-FASTING   Result Value Ref Range    SODIUM 143 136 - 145 mmol/L    POTASSIUM 3.8 3.5 - 5.1 mmol/L    CHLORIDE 110 (H) 98 - 107 mmol/L    CO2 TOTAL 24 21 - 32 mmol/L    ANION GAP 9 (L) 10 - 20 mmol/L    BUN 26 (H) 7 - 18 mg/dL    CREATININE 1.34 (H) 0.70 - 1.30 mg/dL    BUN/CREA RATIO 19     ESTIMATED GFR 65 >59 mL/min/1.65m^2    ALBUMIN 3.6 3.4 - 5.0 g/dL    CALCIUM 8.5 8.5 - 10.1 mg/dL    GLUCOSE 105 74 - 106 mg/dL  ALKALINE PHOSPHATASE 81 46 - 116 U/L    ALT (SGPT) 85 (H) <=78 U/L    AST (SGOT) 67 (H)  15 - 37 U/L    BILIRUBIN TOTAL 0.5 0.2 - 1.0 mg/dL    PROTEIN TOTAL 7.5 6.4 - 8.2 g/dL    ALBUMIN/GLOBULIN RATIO 0.9 0.8 - 1.4    OSMOLALITY, CALCULATED 290 270 - 290 mOsm/kg    CALCIUM, CORRECTED 8.9 mg/dL    GLOBULIN 3.9    CREATINE KINASE (CK), TOTAL, SERUM   Result Value Ref Range    CREATINE KINASE 500 (H) 39 - 308 U/L   CBC WITH DIFF   Result Value Ref Range    WBCS UNCORRECTED      WBC 12.5 (H) 4.0 - 10.5 x10^3/uL    RBC 4.46 4.20 - 6.00 x10^6/uL    HGB 13.1 (L) 13.5 - 18.0 g/dL    HCT 38.2 (L) 42.0 - 51.0 %    MCV 85.6 78.0 - 99.0 fL    MCH 29.3 27.0 - 32.0 pg    MCHC 34.3 32.0 - 36.0 g/dL    RDW 15.7 (H) 11.6 - 14.8 %    PLATELETS 245 140 - 440 x10^3/uL    MPV 7.8 7.4 - 10.4 fL    NEUTROPHIL % 79 (H) 40 - 76 %    LYMPHOCYTE % 9 (L) 25 - 45 %    MONOCYTE % 12 0 - 12 %    EOSINOPHIL % 0 0 - 7 %    BASOPHIL % 0 0 - 3 %    NEUTROPHIL # 9.86 (H) 1.80 - 8.40 x10^3/uL    LYMPHOCYTE # 1.12 1.10 - 5.00 x10^3/uL    MONOCYTE # 1.46 (H) 0.00 - 1.30 x10^3/uL    EOSINOPHIL # 0.06 0.00 - 0.80 x10^3/uL    BASOPHIL # 0.04 0.00 - 0.30 x10^3/uL   ETHANOL, SERUM   Result Value Ref Range    ETHANOL <3 <=3 mg/dL   ACETAMINOPHEN LEVEL   Result Value Ref Range    ACETAMINOPHEN LEVEL 0    SALICYLATE ACID LEVEL   Result Value Ref Range    SALICYLATE LEVEL <3 (L) 3 - 20 mg/dL   ECG 12 LEAD   Result Value Ref Range    Ventricular rate 53 BPM    Atrial Rate 53 BPM    PR Interval 162 ms    QRS Duration 86 ms    QT Interval 438 ms    QTC Calculation 410 ms    Calculated P Axis 74 degrees    Calculated R Axis 21 degrees    Calculated T Axis 44 degrees       Imaging Studies:        ECG 12 LEAD  Sinus bradycardia with sinus arrhythmia  Minimal voltage criteria for LVH, may be normal variant ( Sokolow-Lyon )  Borderline ECG  No previous ECGs available      Assessment/Plan:  Renal insufficiency.  He is to hydrate well.  In a few days we can repeat his renal function.  He is on cows protocol for drug withdrawal    Problem List:  There are no  active hospital problems to display for this patient.      DVT/PE Prophylaxis:  Early ambulation      Arline Asp, PA-C  [facility] HOSPITALIST

## 2022-01-21 DIAGNOSIS — F411 Generalized anxiety disorder: Secondary | ICD-10-CM

## 2022-01-21 DIAGNOSIS — F1220 Cannabis dependence, uncomplicated: Secondary | ICD-10-CM

## 2022-01-21 DIAGNOSIS — F333 Major depressive disorder, recurrent, severe with psychotic symptoms: Secondary | ICD-10-CM

## 2022-01-21 DIAGNOSIS — F1520 Other stimulant dependence, uncomplicated: Secondary | ICD-10-CM

## 2022-01-21 DIAGNOSIS — F1120 Opioid dependence, uncomplicated: Secondary | ICD-10-CM

## 2022-01-21 DIAGNOSIS — F1420 Cocaine dependence, uncomplicated: Secondary | ICD-10-CM

## 2022-01-21 LAB — LIPID PANEL
CHOL/HDL RATIO: 3.9
CHOLESTEROL: 101 mg/dL (ref <200)
HDL CHOL: 26 mg/dL (ref 23–92)
LDL CALC: 54 mg/dL (ref 0–100)
TRIGLYCERIDES: 106 mg/dL (ref <=150)
VLDL CALC: 21 mg/dL (ref 0–50)

## 2022-01-21 LAB — GLUCOSE, NON FASTING: GLUCOSE: 108 mg/dL — ABNORMAL HIGH (ref 74–106)

## 2022-01-21 LAB — TRIGLYCERIDE: TRIGLYCERIDES: 106 mg/dL (ref <=150)

## 2022-01-21 LAB — HGA1C (HEMOGLOBIN A1C WITH EST AVG GLUCOSE): HEMOGLOBIN A1C: 6.1 % — ABNORMAL HIGH (ref 4.0–6.0)

## 2022-01-21 MED ORDER — DIVALPROEX 500 MG TABLET,DELAYED RELEASE
500.0000 mg | DELAYED_RELEASE_TABLET | Freq: Two times a day (BID) | ORAL | Status: DC
Start: 2022-01-21 — End: 2022-01-23
  Administered 2022-01-21 – 2022-01-23 (×5): 500 mg via ORAL
  Filled 2022-01-21 (×5): qty 1

## 2022-01-21 MED ORDER — CYCLOBENZAPRINE 10 MG TABLET
10.0000 mg | ORAL_TABLET | Freq: Three times a day (TID) | ORAL | Status: DC
Start: 2022-01-21 — End: 2022-01-23
  Administered 2022-01-21 – 2022-01-23 (×6): 10 mg via ORAL
  Administered 2022-01-23: 0 mg via ORAL
  Filled 2022-01-21 (×7): qty 1

## 2022-01-21 MED ORDER — DICYCLOMINE 10 MG CAPSULE
10.0000 mg | ORAL_CAPSULE | Freq: Three times a day (TID) | ORAL | Status: DC
Start: 2022-01-21 — End: 2022-01-23
  Administered 2022-01-21 – 2022-01-23 (×6): 10 mg via ORAL
  Administered 2022-01-23: 0 mg via ORAL
  Filled 2022-01-21 (×7): qty 1

## 2022-01-21 MED ORDER — CITALOPRAM 10 MG TABLET
10.0000 mg | ORAL_TABLET | Freq: Every day | ORAL | Status: DC
Start: 2022-01-21 — End: 2022-01-23
  Administered 2022-01-21 – 2022-01-23 (×3): 10 mg via ORAL
  Filled 2022-01-21 (×3): qty 1

## 2022-01-21 NOTE — Group Note (Signed)
Group topic:  SYMPTOM MANAGEMENT    Date of group:  01/21/2022  Start time of group:  1000  End time of group:  1040                 Summary of group discussion: Discussed making changes after discharge      Nathan Flowers  is a 51 y.o. male participating in a activity group.    DID NOT ATTEND    Hulen Shouts Abagail Limb, LCSW  01/21/2022, 11:03

## 2022-01-21 NOTE — Care Plan (Signed)
PT IS CALM AND COOPERATIVE, COMPLIANT WITH MEDS AND TREATMENTS       Problem: Adult Behavioral Health Plan of Care  Goal: Plan of Care Review  Outcome: Ongoing (see interventions/notes)     Problem: Adult Behavioral Health Plan of Care  Goal: Patient-Specific Goal (Individualization)  Outcome: Ongoing (see interventions/notes)     Problem: Adult Behavioral Health Plan of Care  Goal: Adheres to Safety Considerations for Self and Others  Outcome: Ongoing (see interventions/notes)

## 2022-01-21 NOTE — H&P (Signed)
Date of Admission: 08/11/2019    Reason for hospitalization:  depression, confusion, substance use, anxiety, auditory hallucinations    Type of admission:  Voluntary    Chief Complaint:  "I have been rough "    HPI:  Mr. Nathan Flowers is a 51 year old white male with a history of depression and anxiety complicated by substance use admitted on a voluntary basis from [facility] regional emergency room after being found covered in mud in a ditch along the road.  The patient has been using heroin, amphetamines, cocaine, and marijuana.  He has been off of medications and has had increasing psychosis, depression, suicidal ideation.  I had the patient here in January of 2021 under very similar circumstances.  He has not been following up on outpatient basis.. The patient was positive for cocaine, amphetamines, and marijuana and has been using opiates. He has some opiate withdrawal symptoms. Labs were essentially within normal limits. He had some elevated CK which was alleviated with hydration.  Patient's biggest complaints are feeling "sore all over ".  He can not name any specific stressors.  He has not made any attempts to harm himself.  He has been picking at his skin more intensely and has numerous excoriations.  Patient's drug of choice is opiates and cocaine which he snorts. He has never used IV drugs. He is interested in a long-term substance abuse treatment. He states that family and friends been worried about him and encouraged him to seek inpatient treatment.     Current medications: None    Past psych history:  The patient has 2 previous inpatient psychiatric hospitalizations here at the [facility] with the last being in January of 2021. He denies any previous psychiatric inpatient treatment. He does not follow-up on an outpatient basis with any psychiatric follow-up. He is noncompliant with referrals to Suboxone clinics. he has been using drugs over 20+ years. Most recently cocaine, heroin, methamphetamines, and  opiates. He has no history of detox treatment. No history of PTSD, OCD, panic disorder. He does a history of psychosis mostly related to substance intoxication and use. The patient has a history of seizures, it is not specified if he has seizures from withdrawal versus seizure disorder.    Past medical history:  Patient has no known drug allergies. He is a smoker 1 pack cigarettes per day. He has an unspecified seizure disorder, history of colitis, status post appendectomy. He denies any history of loss of consciousness or head trauma.    Physical Exam/ ROS: Please see dictated hospitalist note dated 01/21/2022    Family Psychiatric History:  The patient states he is adopted but he is not aware of any psychiatric family history    Family Medical History:  Patient is unaware as he was adopted.    Social History:  Patient was born and raised in [place], [place]. He was adopted as an infant. He has no history of physical or sexual abuse. He is currently unemployed, the patient states he did graduate from high school and does have some college. He denies any current legal issues. The patient admits to a long history of substance abuse of over 20 years. He has used opiates, heroin, cocaine, and methamphetamines. He recants a good childhood, free from abuse or neglect. He states his main support consist primarily of himself. He is single. He has 2 children. He has no current legal problems. No military service history.    MSE:  The patient is alert and oriented x4, casually dressed, fair  eye contact, disheveled , dirty skin and fingernails,, multiple skin lesions secondary to methamphetamine abuse, appearing stated age. Speech is normal rate and tone. Patient is somewhat talkative and appears depressed and anxious. He has subjective racing thoughts. There is no flight of ideas, loosening of associations, or tangential speech. Not manic. Mood is sad. Affect congruent. Patient does not  appear to be in any acute physical distress. But he does look very disheveled and ill from withdrawals from opiates and cocaine. Positive recent suicidal ideations, no homicidal ideation. No auditory or visual hallucinations, no delusions, no paranoia. No signs of psychosis. no plans to harm self, no plans to harm others. Patient is not aggressive or threatening. Thoughts are linear, logical, and goal directed. Intellectual functioning is good. Memory is intact to recent, remote, and past events. Patient can recall 3 of 3 objects at 0 and 5 minutes, and what was eaten for last meal. Patient is able to provide details of current situation. Patient can name the president, vice president, and governor. Language is good. Vocabulary is unimpaired, no word finding difficulty or word misuse. Intelligence is good, patient can interpret a proverb, and reports apple and orange similarity. Calculation is unimpaired. Concentration is good, able to recite days of week forward and backward. Insight is good; patient is aware of their illness, how it affects their functioning, and what needs to happen for future improvement. Judgement is good; patient is compliant with treatment and can relate appropriately to what they would do if smelling smoke in a theater, or finding stamped addressed envelope.    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 He is a smoker 1 pack cigarettes per day. He has an unspecified seizure disorder, history of colitis, status post appendectomy. He denies any history of loss of consciousness or head trauma.  Axis 4:  Severe psychosocial stressors  Axis 5: Current GAF: 20, best in past year: Unknown    Patient's strengths and weaknesses: See counselor's psychosocial evaluation.    Provisional Discharge Plan:  Back to home with outpatient follow-up    Plan:  The patient will be admitted for stabilization. Medicines will be instituted and adjusted. I will start the patient on Celexa and trazodone.  We will provide Bentyl and Flexeril for opiate withdrawal.I will also start him on Depakote out of concern for seizure disorder. I will hold off on Risperdal for now.  We have discussed medicine side-effects and treatment options. The patient agrees with the plan and will be encouraged to be active in groups and milieu treatment. Patient voices understanding and agrees with the plan. Patient aware of unit rules and regulations and will be seen by the hospitalist for medical issues. Further changes will be dictated by clinical course.  I estimate patient length of stay will be 7-10 days.    I certify that the inpatient psychiatric admission is medically necessary for treatment which can be reasonably expected to improve the patient's condition, and/or for diagnostic study.    Time taken for dictation, treatment team staffing, patient interview, paperwork preparation, and review of the record exceeded 75 minutes on the day of admission and was of a complex level of decision making.

## 2022-01-21 NOTE — Nurses Notes (Signed)
7P-NOTE-PT IS AWAKE AND ALERT,COOPERATIVE. REPORTS ANXIETY A 7/10 AND DEPRESSION A 2/10. PT DENIES ANY SI/HI AND AVH. PT VOICED NO COMPLAINTS.  WILL CONTINUE TO MONITOR WITH Q 15 MIN ROUNDS.

## 2022-01-21 NOTE — Group Note (Signed)
Group topic:  PROCESS THERAPY    Date of group:  01/21/2022  Start time of group:  1300  End time of group:  1400               Summary of group discussion:    Cognitive stimulation therapy.      HYATT CAPOBIANCO  is a 51 y.o. male participating in a process group.    DID NOT ATTEND    Comments:    [name], [place]  01/21/2022, 13:59

## 2022-01-21 NOTE — Nurses Notes (Signed)
Pt in own room during assessment. Alert & cooperative w/meds & assessment. Rates anxiety 5 & depression 6 on 1-10 scale. Denies SI/HI, A/V hallucinations, sleep, or appetite suppression. Last BM 01/20/2022. No acute distress noted. Pt states "My whole body hurts, I was found in a ditch on the side of the road." When asked to rate his pain on a scale of 0-10 he stated it was a 7/10. Pt was resting when I entered the room, but had finished his whole breakfast this AM. Visual safety checks q8min & prn. Will continue to monitor.

## 2022-01-22 DIAGNOSIS — R1110 Vomiting, unspecified: Secondary | ICD-10-CM

## 2022-01-22 DIAGNOSIS — E860 Dehydration: Secondary | ICD-10-CM

## 2022-01-22 DIAGNOSIS — F19239 Other psychoactive substance dependence with withdrawal, unspecified: Secondary | ICD-10-CM

## 2022-01-22 DIAGNOSIS — R030 Elevated blood-pressure reading, without diagnosis of hypertension: Secondary | ICD-10-CM

## 2022-01-22 DIAGNOSIS — R001 Bradycardia, unspecified: Secondary | ICD-10-CM

## 2022-01-22 DIAGNOSIS — F1590 Other stimulant use, unspecified, uncomplicated: Secondary | ICD-10-CM

## 2022-01-22 LAB — URINE CULTURE,ROUTINE: URINE CULTURE: NO GROWTH

## 2022-01-22 MED ORDER — ONDANSETRON 4 MG DISINTEGRATING TABLET
4.0000 mg | ORAL_TABLET | ORAL | Status: AC
Start: 2022-01-22 — End: 2022-01-22
  Administered 2022-01-22: 4 mg via ORAL
  Filled 2022-01-22: qty 1

## 2022-01-22 MED ORDER — SODIUM CHLORIDE 0.9 % INTRAVENOUS SOLUTION
Freq: Once | INTRAVENOUS | Status: DC
Start: 2022-01-22 — End: 2022-01-22

## 2022-01-22 MED ORDER — SODIUM CHLORIDE 0.9 % INTRAVENOUS SOLUTION
Freq: Once | INTRAVENOUS | Status: AC
Start: 2022-01-22 — End: 2022-01-22

## 2022-01-22 MED ORDER — ONDANSETRON 4 MG DISINTEGRATING TABLET
4.0000 mg | ORAL_TABLET | Freq: Four times a day (QID) | ORAL | Status: DC | PRN
Start: 2022-01-22 — End: 2022-01-23
  Administered 2022-01-22: 4 mg via ORAL
  Filled 2022-01-22: qty 1

## 2022-01-22 MED ORDER — SODIUM CHLORIDE 0.9 % IV BOLUS
1000.0000 mL | INJECTION | Freq: Once | Status: AC
Start: 2022-01-22 — End: 2022-01-22
  Administered 2022-01-22: 0 mL via INTRAVENOUS
  Administered 2022-01-22: 1000 mL via INTRAVENOUS

## 2022-01-22 MED ORDER — AMLODIPINE 5 MG TABLET
2.5000 mg | ORAL_TABLET | Freq: Every day | ORAL | Status: DC
Start: 2022-01-22 — End: 2022-01-23
  Administered 2022-01-22 – 2022-01-23 (×2): 2.5 mg via ORAL
  Filled 2022-01-22 (×2): qty 1

## 2022-01-22 NOTE — Group Note (Signed)
Psychoeducational Group Note  Date of group:  01/22/2022  Start time of group:  1005  End time of group:  1105               Summary of group discussion: coping with depression and anxiety  Life is hard and depression and anxiety or any other mental illness can make if feel almost impossible to deal with sometimes. BUT you can feel  better so you can face life's challenges.   Depression, anxiety, anger, frustration, fear, etc  isn't something a person can just snap out of. Due to various underlying reasons, it may be something you have to learn to cope with. You can still have a good quality of life despite depression. Taking care of your daily needs and staying involved with activities you enjoy are the right places to start when trying to cope with your struggles.   Don't try to hide your struggles; it isn't something to be embarrassed or ashamed about. Find people you can trust to talk to about your feelings. This can be friends, family members, support groups, and professionals. Understand your triggers to do what you can to reduce symptoms of depression/anxiety. For many individuals, taking daily medication can help.   15 ways to cope    1. Take care of your daily needs   Try to stick with a schedule to help you cope with depression/anxiety or struggles. Try to go to bed at the same time each night and get up at the same time each morning. You may be tempted to sleep all day to help avoid feeling anything at all. Get out of bed, take a shower, and eat something good for yourself. Don't skip meals and don't emotionally eat. Try to consume foods offering the right balance of vitamins and nutrients.   Don't skip your hygiene habits when you are struggling. You will feel better getting dressed and combing your hair. For women, styling your hair and putting on some makeup can help to boost your mood. Never feel guilty about taking some time for yourself! You may need a break from a busy schedule. Soak in the bathtub,  get a sitter for a few hours, or say no when you are already overextended and asked to do more.   2. Indulge a bit   There is nothing wrong with pampering yourself when you need it the most! Do your nails at home or enjoy your favorite dessert. Watch movies you have seen before but enjoy. Curl up on the porch with a good book and become wholly absorbed in it. Coping with your struggles doesn't mean you need to wallow or suffer in it.?   3. Enjoy the sunlight   Being inside more in the colder months can increase depression. Staying home due to COVID-19 can do this too. Try to enjoy the sunlight more as it will help lift your mood and reduce symptoms of depression. Open curtains at home and let the sun come in. Bundle up and go for a brisk walk around the neighborhood. Go for a drive and listen to your favorite music. Find places close by where you can hike and get back to nature. Being outdoors and getting more sunlight will be refreshing.   4. Reduce Stress   Everyone has to deal with stress now and then. If it is all the time due to work or other issues, it is time to nip that in the bud. Reduce stress where you can, even if  you must make difficult decisions to do so. Ongoing stress takes a toll on the mind and body. It makes it hard to focus and hard to find pleasure in anything.   Practice relaxation techniques to help you manage stress.?Meditation?and yoga can be good options, and they can be done at home.?Deep breathing?can help you keep your cool in a stressful situation. Focusing on specific muscle groups and relaxing them one by one can help you unwind and sleep better. Exercise is another great way to reduce stress.    5. Exercise Regularly   Exercise can help with coping with depression because it boosts the levels of various chemicals in the brain. These chemicals are responsible for mood, so increasing them can lower the symptoms of depression. Exercise can be anything you enjoy doing. You can go for  walks, play sports outside with the kids, or go to the gym.   Strive to get at least 30 minutes per day of exercise. Mixing up the activities will prevent you from getting bored. It shouldn't be seen as a punishment or something you dread. Instead, it should be something you look forward to doing for your physical and mental well-being. Depression/anxiety can cause fatigue, and regular exercise will help you keep your energy levels up.   6. Positive Mindset   Negative thoughts and feelings can spread through your mind and body fast. They can be toxic to your well-being and make you second guess decisions. Strive to change that around with a positive mindset. When you get up in the morning, think of good things to look forward to that day. Before you go to sleep, think of all the good things in your life and why you appreciate them.   Be aware of negative thoughts, and immediately change them around. Replace that thought with a positive one. It takes some time to train your brain to do this. Over time, you will encounter negative thoughts less frequently, and it is natural for you to have a positive mindset.   7. Talk to someone you trust   Don't hide your struggles from others. Society is getting better about reducing the stigma associated with depression. Talk to someone you trust about how you feel. Let your partner and family know what you are struggling with so they can understand you better. Share your thoughts and feelings with a friend you can count on.   8. Take care of tasks   You may not feel like paying the bills or taking out the trash but stay on top of such tasks. Take care of your home and your responsibilities. If you slack with them, your depression is bound to get worse. Your home doesn't have to be spotless, but make sure you pick up regularly and take care of routine cleaning needs. Make a list of items to complete for the day. It feels good to get them marked off!   9. Stay Connected   You may  not feel comfortable telling everyone you have depression/anxiety, and you don't have to. Take the time to stay connected to friends and family, though. Isolating yourself because of depression and anxiety  is common. It is harder to be connected right now due to COVID-19, but try to do what you can. Talk on the phone, in person or send emails.?   Volunteer your time to help stay connected to others and help your community. A few hours per week can help you feel better. Many food banks and shelters  need all the assistance they can get. Check with local animal shelters, too, about coming in to assist with caring for pets. Schools and [place] often need volunteers, also because they have low budgets to work with.   10. Stay Involved with Activities you LOVE   Find activities and hobbies you are passionate about and take part in them regularly. When you have depression and anxiety, you may feel like you go through the motions sometimes. Continue to stay involved with the activities you love. Don't be tempted to push them aside. When you feel like that, consider trying something new. This can spark a new passion for you to spend time with.   11. Avoid Drugs and Alcohol   Consuming drugs and/or alcohol regularly or in large amounts can make depression and anxiety worse. Some individuals use this to cope with the problem, but they don't realize they are compounding it. Do not use drugs or alcohol if you have depression. This will help you to feel your best.    12. Identifying your Triggers   Pay attention to triggers which influence your sadness, fear, anger, nervousness, etc. This can be people around you, places you visit, or even your job. When you can identify triggers, you are in control of your depression. Make changes to help you reduce the opportunity for such triggers to affect you. This may mean ending relationships with toxic people, improving your communication skills, and changing jobs.   1. Journal   Get  your thoughts and feelings on paper. Create a?mindfulness journal?and try to write in it every day. Document how you feel, triggers you experienced, and positive events too. This can be therapeutic and help your mindset.   15. Professional Help   Professional help is an excellent option to cope with mental illness. Consider?finding a therapist?to help. The counselor can complete an assessment to determine your level of depression. The sessions can be individual or in a group setting. They can help you reach out for support, evaluate options, and make positive changes. Medication can help with changing the chemicals in the brain.   It takes time to find the right medication, be patient and work closely with your doctor. They will start you on a low dose to take each day. Don't change the dose, and don't skip taking it, even when you feel great! The dose may need to be increased, or the medicine changed depending on the results from it. Share information with the professionals to assist you with the best medication to help you cope with depression.   15. Use Methods that Work for [employer]   A variety of the above methods may work for you to help control depression or anxiety. Your own needs and other variables will influence the value they offer to you. Pay attention to your needs and how you feel. If you believe the depression is getting out of control or you have thoughts of hurting yourself, reach out for immediate help. It is possible to live a good quality of life despite depression. Developing coping skills, you can count on to help you through it will make a difference!   Remember everyone has a purpose to be here, everyone deserves to feel happy and satisfaction with life. Sometimes we just need help finding our purpose or getting to a point to where we can feel happy which can lead to feeling satisfied with life. A pill cannot fix everything, we have to work at it as well BUT it  is possible.   Discussed about  setting boundaries/limits. Discussed how we cannot control anyone else; cannot make them care, understand, willing to work with [place], be supportive. We can only control our behavior and our decisions.   Discussed how every person is different so what coping methods work for some may not work for everyone; encouraged patients to keep trying things (healthy and safe) to see what works best for them. Also discussed what works in some situations may  not work in every situation.       Did not attend group    Lendon Colonel, [place] 01/22/2022 11:20

## 2022-01-22 NOTE — Group Note (Signed)
Group topic:  PROCESS THERAPY    Date of group:  01/22/2022  Start time of group:  1300  End time of group:  1400               Summary of group discussion:    Personality Disorders      Nathan Flowers  is a 51 y.o. male participating in a process group.    DID NOT ATTEND    Comments:    [name], [place]  01/22/2022, 14:32

## 2022-01-22 NOTE — Nurses Notes (Addendum)
Rechecked BP 170/98 HR 40. Patient c/o nausea and vomiting throughout night. Informed charge nurse, Laray Anger, RN.    7a-7p nursing shift assessment. Patient alert and oriented x 3. Patient rates anxiety as a 7/10 and rates depression as a 2/10. Patient denies SI ,HI and A/V/T hallucinations. Patient reports not eating d/t nausea and vomiting throughout night and sleeping fair. Patient withdrawn to room. Patient is medication compliant. Normal saline infusing at 151ml/hr. Will continue to visually monitor Q15MIN and prn throughout shift by staff.     1278 unable to medicate with clonidine for withdrawal symptoms d/t hr, medicated with vistaril.    1057 continues to vomit, medicated with Zofran as ordered.    Recheck of vs- 140/97 58     1700 patient has not been able to eat today d/t nausea and vomiting. Patient has been drinking po fluids. Iv fluids continue to infuse.

## 2022-01-22 NOTE — Group Note (Signed)
Group topic:  COMMUNITY MEETING GROUP    Date of group:  01/22/2022  Start time of group:  1730  End time of group:  1745               Summary of group discussion: community meeting went over some rules on unit .      Nathan Flowers  is a 51 y.o. male participating in a community meeting group.    Patient observations: did not attend      Patient goals:      [name], PAT CARE [facility]  01/22/2022, 18:02

## 2022-01-22 NOTE — Progress Notes (Signed)
[facility]  Inpatient Progress Note  [place], [place]  Date of Admission:  01/20/2022  Date of Birth:  Sep 18, 1970  Date of Service:  01/22/2022    Mr. Nathan Flowers seen today in follow up of continuing psychiatric symptoms.  Notes and labs reviewed.  Case discussed with treatment team and hospitalist.  Today the patient is having a lot of nausea and vomiting with GI complaints related to opiate withdrawal.  He appears a bit dehydrated and I am running IV fluids and now due to hypotension issues.  He does not have any other physical complaints.  He was able to eat a little bit yesterday.  Hospitalist will evaluate with the patient today as well.  He has been in bed most of the morning.    Takes meds.  No reported side effects.   No reports of problem behaviors or violence.  No prn meds required.  No physical complaints on review of systems.    Alert and oriented x4.  Casual dress, calm, well groomed.  No SI/HI/AVH, delusions, or paranoia.  Thoughts are logical, coherent, goal directed.  Good eye contact. Speech is normal rate and tone.  Mood is ``ok affect congruent.  No psychomotor agitation or psychomotor retardation, no cogwheel rigidity or abnormal movements.  Gait is normal.  Attention is good.  Concentration and memory good.  No cognitive deficits noted.  Judgment fair, insight fair.  Calculation and abstraction are within normal limits.  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  I encouraged groups and working on  coping skills.  To work with counselors as indicated.    We will give the patient IV fluids.  Consult hospitalist for evaluation.  Continue medications.  Watch for problems.  Offered support.  The patient does not look to be in any acute physical distress however he does look a bit uncomfortable with GI complaints.  Continue support, safety, groups, observation and allow time for further improvement.  I certify that this psychiatric inpatient admission is medically necessary for treatment which can reasonably be expected to improve the patients condition.  Buford Dresser, MD

## 2022-01-22 NOTE — Group Note (Signed)
Name: Nathan Flowers   Date of Birth: 06-20-1971   Today's Date: 01/22/2022   Group Start Time: 1700   Group End Time: 49   Group Topic: ACTIVITY GROUP  Number of participants: 9    Summary of group discussion:   Patients worked on  Radiographer, therapeutic  through psychical therapy (cornhole)      Mannie's Participation: did not group

## 2022-01-22 NOTE — Care Plan (Signed)
PT IS CALM AND COOPERATIVE, COMPLIANT WITH MEDS AND TREATMENTS       Problem: Adult Behavioral Health Plan of Care  Goal: Plan of Care Review  Outcome: Ongoing (see interventions/notes)  Flowsheets (Taken 01/21/2022 2039)  Patient Agreement with Plan of Care: agrees     Problem: Adult Behavioral Health Plan of Care  Goal: Patient-Specific Goal (Individualization)  Outcome: Ongoing (see interventions/notes)     Problem: Adult Behavioral Health Plan of Care  Goal: Adheres to Safety Considerations for Self and Others  Outcome: Ongoing (see interventions/notes)

## 2022-01-22 NOTE — Progress Notes (Cosign Needed)
[facility] IP PROGRESS NOTE      Vincient, Vanaman  Date of Admission:  01/20/2022  Date of Birth:  [birth year]  Date of Service:  01/22/2022    Hospital Day:  LOS: 2 days     HPI:  I was asked to see the patient as he is not feeling well.  He has high blood pressure.  His heart rate has been bradycardic.  He has been vomiting some and states that he is withdrawing from drug abuse.  He has IV fluids going now which I agree with.  I will order a 2 L of fluid to continue.  Concerning his high blood pressure off place him on very low-dose Norvasc.  I will repeat labs in the morning    Vital Signs:  Temp (24hrs) Max:36.9 C (98.4 F)      Temperature: 36.5 C (97.7 F)  BP (Non-Invasive): (!) 140/97  Heart Rate: 58  Respiratory Rate: 18  SpO2: 98 %    Current Medications:  acetaminophen (TYLENOL) tablet, 650 mg, Oral, Q4H PRN  aluminum-magnesium hydroxide-simethicone (MAG-AL PLUS) 200-200-20 mg per 5 mL oral liquid, 30 mL, Oral, Q4H PRN  amLODIPine (NORVASC) tablet, 2.5 mg, Oral, Daily  citalopram (CeleXA) tablet, 10 mg, Oral, Daily  cloNIDine (CATAPRES) tablet, 0.1 mg, Oral, Q4H PRN  cyclobenzaprine (FLEXERIL) tablet, 10 mg, Oral, 3x/day  dicyclomine (BENTYL) capsule, 10 mg, Oral, 3x/day  divalproex (DEPAKOTE) 12 hr delayed release tablet, 500 mg, Oral, 2x/day  hydrOXYzine pamoate (VISTARIL) capsule, 25 mg, Oral, 3x/day PRN  LORazepam (ATIVAN) 2 mg/mL injection, 2 mg, IntraMUSCULAR, Q6H PRN  magnesium hydroxide (MILK OF MAGNESIA) 400mg  per 34mL oral liquid, 30 mL, Oral, HS PRN  nicotine (NICODERM CQ) transdermal patch (mg/24 hr), 21 mg, Transdermal, Daily  ondansetron (ZOFRAN ODT) rapid dissolve tablet, 4 mg, Oral, Q6H PRN  traZODone (DESYREL) tablet, 50 mg, Oral, HS PRN - MR x 1  ziprasidone (GEODON) IM injection, 20 mg, IntraMUSCULAR, Q12H PRN        Current Orders:  Active Orders   Diet    DIET REGULAR Do you want to initiate MNT Protocol? Yes; Additional modifications/limitations: DOUBLE  PORTIONS     Frequency: All Meals     Number of Occurrences: 1 Occurrences   Nursing    BEHAVIORAL MED SAFETY CHECKS     Frequency: Q15MIN     Number of Occurrences: 999 Occurrences    PT IS LOW RISK FOR VENOUS THROMBOEMBOLISM     Frequency: CONTINUOUS     Number of Occurrences: Until Specified    REFERRAL TO [email or web address] - FOR ENROLLMENT IN FREE, ONLINE GROUP CLASSES FOR TOBACCO CESSATION     Frequency: ONE TIME     Number of Occurrences: 1 Occurrences     Order Comments: Visit [email or web address] to access FREE online modules to take at your own pace or group classes to help you quit tobacco.       Scheduling Instructions:      Visit [email or web address] to access FREE online modules to take at your own pace or group classes to help you quit tobacco.    RESTRICTIONS - NO VISITORS     Frequency: UNTIL DISCONTINUED     Number of Occurrences: Until Specified    TOBACCO CESSATION: I HAVE DISCUSSED REFERRAL FOR TOBACCO CESSATION COUNSELING AND INITIATION OF NICOTINE REPLACMENT THERAPY WITH THIS PATIENT.     Frequency: ONE TIME     Number of Occurrences: 1 Occurrences    VITAL  SIGNS  EVERY 12 HRS (0800-2000)     Frequency: EVERY 12 HRS (0800-2000)     Number of Occurrences: Until Specified   Code Status    FULL CODE     Frequency: CONTINUOUS     Number of Occurrences: Until Specified   Consult    IP CONSULT TO HOSPITALIST Requested Provider; Dionne Milo     Frequency: ONE TIME     Number of Occurrences: 1 Occurrences   Behavioral Health Services    COWS ASSESSMENT     Frequency: ONE TIME     Number of Occurrences: 1 Occurrences   Precaution    BMED SPECIAL PRECAUTIONS OBSERVE EVERY FIFTEEN MINUTES     Frequency: UNTIL DISCONTINUED     Number of Occurrences: Until Specified    LOW RISK SUICIDE PRECAUTIONS (PAVILION)     Frequency: UNTIL DISCONTINUED     Number of Occurrences: Until Specified   Schedule Follow Up Orders    REFERRAL TO [facility] QUITLINE - TOBACCO CESSATION     Frequency: ONE TIME     Number of Occurrences: 1  Occurrences     Order Comments: Please note, placing this order means you have discussed this program with the patient and they are willing to participate.     Medications    acetaminophen (TYLENOL) tablet     Frequency: Q4H PRN     Dose: 650 mg     Route: Oral    aluminum-magnesium hydroxide-simethicone (MAG-AL PLUS) 200-200-20 mg per 5 mL oral liquid     Frequency: Q4H PRN     Dose: 30 mL     Route: Oral    amLODIPine (NORVASC) tablet     Frequency: Daily     Dose: 2.5 mg     Route: Oral    citalopram (CeleXA) tablet     Frequency: Daily     Dose: 10 mg     Route: Oral    cloNIDine (CATAPRES) tablet     Frequency: Q4H PRN     Dose: 0.1 mg     Route: Oral    cyclobenzaprine (FLEXERIL) tablet     Frequency: 3x/day     Dose: 10 mg     Route: Oral    dicyclomine (BENTYL) capsule     Frequency: 3x/day     Dose: 10 mg     Route: Oral    divalproex (DEPAKOTE) 12 hr delayed release tablet     Frequency: 2x/day     Dose: 500 mg     Route: Oral    hydrOXYzine pamoate (VISTARIL) capsule     Frequency: 3x/day PRN     Dose: 25 mg     Route: Oral    LORazepam (ATIVAN) 2 mg/mL injection     Frequency: Q6H PRN     Dose: 2 mg     Route: IntraMUSCULAR    magnesium hydroxide (MILK OF MAGNESIA) 400mg  per 96mL oral liquid     Frequency: HS PRN     Dose: 30 mL     Route: Oral    nicotine (NICODERM CQ) transdermal patch (mg/24 hr)     Frequency: Daily     Dose: 21 mg     Route: Transdermal    ondansetron (ZOFRAN ODT) rapid dissolve tablet     Frequency: Q6H PRN     Dose: 4 mg     Route: Oral    traZODone (DESYREL) tablet     Frequency: HS PRN - MR x 1  Dose: 50 mg     Route: Oral    ziprasidone (GEODON) IM injection     Frequency: Q12H PRN     Dose: 20 mg     Route: IntraMUSCULAR        Review of Systems:  Focused review of system was completed. Refer to the HPI for ROS details.     Today's Physical Exam:    General:  Patient is pleasant cooperative.  Does not feel well appears somewhat ill  HEENT: NC/AT   eyes  normal  Lungs:  Lungs  clear, normal breathing, non-labored  CV: Regular rate and rhythm.   Abd:  non-tender, soft.  Extremities: no edema, normal appearance, pulses intact  Skin: no rashes, lesions,abscesses. Normal color.  Neuro: No focal deficits. Normal tone.         I/O:  I/O last 24 hours:  No intake or output data in the 24 hours ending 01/22/22 1116  I/O current shift:  No intake/output data recorded.    Labs  Please indicate ordered or reviewed)  Reviewed: Lab Results Today:  No results found for any visits on 01/20/22 (from the past 24 hour(s)).       Problem List:  Active Hospital Problems   (*Primary Problem)    Diagnosis   . *MDD (major depressive disorder), recurrent, severe, with psychosis (CMS [facility])   . GAD (generalized anxiety disorder)       Assessment/ Plan:  Dehydration and drug withdrawal.  He is getting IV fluids.  I will start him on low-dose Norvasc for his hypertension.  We will repeat labs in the morning.    Arline Asp, PA-C

## 2022-01-22 NOTE — Behavioral Health (Signed)
I entered patient's room this morning finding him to be lying on bed with IV being administered.  Patient states he is feeling rough.  He is speaking a very soft tone difficult to understand having very minimal eye contact.  I inquired as if he was in needed anything to which she replied no except as to have the lights turned off in room.  I encouraged him to attempt to stay hydrated as much as possible and to communicate needs to staff as they will continue to in her room on 15 minute rounding.  Patient verbalizes understanding.

## 2022-01-22 NOTE — Group Note (Signed)
Name: Nathan Flowers   Date of Birth: Jun 23, 1971   Today's Date: 01/22/2022   Group Start Time: 1430   Group End Time: 1520   Group Topic: ACTIVITY GROUP  Number of participants: 9    Summary of group discussion:     Took patients outside for leisure and exercise.  Patients worked on  Radiographer, therapeutic  through psychical therapy ( yoga and cornhole) while  listening  to music.       Shepard's Participation:Did not attend

## 2022-01-23 DIAGNOSIS — R519 Headache, unspecified: Secondary | ICD-10-CM

## 2022-01-23 DIAGNOSIS — I10 Essential (primary) hypertension: Secondary | ICD-10-CM

## 2022-01-23 LAB — COMPREHENSIVE METABOLIC PANEL, NON-FASTING
ALBUMIN/GLOBULIN RATIO: 1.1 (ref 0.8–1.4)
ALBUMIN: 4.1 g/dL (ref 3.5–5.7)
ALKALINE PHOSPHATASE: 57 U/L (ref 34–104)
ALT (SGPT): 50 U/L (ref 7–52)
ANION GAP: 6 mmol/L — ABNORMAL LOW (ref 10–20)
AST (SGOT): 30 U/L (ref 13–39)
BILIRUBIN TOTAL: 0.6 mg/dL (ref 0.3–1.2)
BUN/CREA RATIO: 18 (ref 6–22)
BUN: 14 mg/dL (ref 7–25)
CALCIUM, CORRECTED: 9.5 mg/dL (ref 8.9–10.8)
CALCIUM: 9.6 mg/dL (ref 8.6–10.3)
CHLORIDE: 101 mmol/L (ref 98–107)
CO2 TOTAL: 26 mmol/L (ref 21–31)
CREATININE: 0.77 mg/dL (ref 0.60–1.30)
ESTIMATED GFR: 109 mL/min/{1.73_m2} (ref >59)
GLOBULIN: 3.7 (ref 2.9–5.4)
GLUCOSE: 112 mg/dL — ABNORMAL HIGH (ref 74–109)
OSMOLALITY, CALCULATED: 268 mOsm/kg — ABNORMAL LOW (ref 270–290)
POTASSIUM: 4.6 mmol/L (ref 3.5–5.1)
PROTEIN TOTAL: 7.8 g/dL (ref 6.4–8.9)
SODIUM: 133 mmol/L — ABNORMAL LOW (ref 136–145)

## 2022-01-23 LAB — CBC WITH DIFF
BASOPHIL #: 0 10*3/uL (ref 0.00–0.30)
BASOPHIL %: 0 % (ref 0–3)
EOSINOPHIL #: 0 10*3/uL (ref 0.00–0.80)
EOSINOPHIL %: 0 % (ref 0–7)
HCT: 41.2 % — ABNORMAL LOW (ref 42.0–51.0)
HGB: 14.6 g/dL (ref 13.5–18.0)
LYMPHOCYTE #: 2.2 10*3/uL (ref 1.10–5.00)
LYMPHOCYTE %: 23 % — ABNORMAL LOW (ref 25–45)
MCH: 29.4 pg (ref 27.0–32.0)
MCHC: 35.4 g/dL (ref 32.0–36.0)
MCV: 83.1 fL (ref 78.0–99.0)
MONOCYTE #: 0.9 10*3/uL (ref 0.00–1.30)
MONOCYTE %: 9 % (ref 0–12)
MPV: 7.7 fL (ref 7.4–10.4)
NEUTROPHIL #: 6.5 10*3/uL (ref 1.80–8.40)
NEUTROPHIL %: 67 % (ref 40–76)
PLATELETS: 307 10*3/uL (ref 140–440)
RBC: 4.96 10*6/uL (ref 4.20–6.00)
RDW: 13.9 % (ref 11.6–14.8)
WBC: 9.7 10*3/uL (ref 4.0–10.5)
WBCS UNCORRECTED: 9.7 10*3/uL

## 2022-01-23 MED ORDER — CHLORDIAZEPOXIDE 10 MG CAPSULE
10.0000 mg | ORAL_CAPSULE | Freq: Three times a day (TID) | ORAL | Status: DC
Start: 2022-01-23 — End: 2022-01-23
  Administered 2022-01-23: 0 mg via ORAL
  Filled 2022-01-23: qty 1

## 2022-01-23 MED ORDER — AMLODIPINE 5 MG TABLET
5.0000 mg | ORAL_TABLET | Freq: Every day | ORAL | Status: DC
Start: 2022-01-24 — End: 2022-01-23

## 2022-01-23 MED ORDER — CITALOPRAM 10 MG TABLET
10.0000 mg | ORAL_TABLET | Freq: Every day | ORAL | 0 refills | Status: DC
Start: 2022-01-24 — End: 2023-01-25

## 2022-01-23 MED ORDER — TRAZODONE 50 MG TABLET
50.0000 mg | ORAL_TABLET | Freq: Every evening | ORAL | 0 refills | Status: DC | PRN
Start: 2022-01-23 — End: 2023-01-25

## 2022-01-23 MED ORDER — IBUPROFEN 400 MG TABLET
400.0000 mg | ORAL_TABLET | Freq: Four times a day (QID) | ORAL | Status: DC | PRN
Start: 2022-01-23 — End: 2022-01-23

## 2022-01-23 MED ORDER — AMLODIPINE 5 MG TABLET
5.0000 mg | ORAL_TABLET | Freq: Every day | ORAL | 0 refills | Status: DC
Start: 2022-01-24 — End: 2023-01-25

## 2022-01-23 MED ORDER — DIVALPROEX 500 MG TABLET,DELAYED RELEASE
500.0000 mg | DELAYED_RELEASE_TABLET | Freq: Two times a day (BID) | ORAL | 0 refills | Status: DC
Start: 2022-01-23 — End: 2023-01-25

## 2022-01-23 NOTE — Progress Notes (Addendum)
[facility]   DISCHARGE SUMMARY        PATIENT NAME:  Nathan Flowers, Nathan Flowers   MRN:  [id number]  DOB:  [birth year]    ENCOUNTER DATE:  01/20/2022  INPATIENT ADMISSION DATE: 01/20/2022    DISCHARGE DATE: 01/23/2022    ADMITTING PHYSICIAN: Nolon Rod  PRIMARY CARE PHYSICIAN: No Pcp     Reason for Admission     Diagnosis        Hallucinations [9925]        DISCHARGE DIAGNOSIS: MDD (major depressive disorder), recurrent, severe, with psychosis (CMS [facility])         Hospital Problems    1MDD (major depressive disorder), recurrent, severe, with psychosis (CMS [facility])         Date Noted: 01/21/2022      GAD (generalized anxiety disorder)         Date Noted: 01/21/2022      Resolved Hospital Problems  No resolved problems to display.      There are no active non-hospital problems to display for this patient.    No Known Allergies                                                                                          DISCHARGE MEDICATIONS:     Current Discharge Medication List      CONTINUE these medications - NO CHANGES were made during your visit.      Details   naloxone 4 mg/actuation Spray, Non-Aerosol  Commonly known as: NARCAN   1 Spray, INTRANASAL, EVERY 2 MIN PRN, for actual or suspected opioid overdose. Call 911 if used.  Qty: 2 Each  Refills: 2            DISCHARGE INSTRUCTIONS:   Patient is leaving AMA.  He should follow up with outpatient providers for medication management and will need a Depakote level and platelet count at first appointment. He would benefit from long-term substance abuse treatment and therapy.    REASON FOR HOSPITALIZATION: Nathan Flowers is a 51 year old white male with a history of depression and anxiety complicated by substance use admitted on a voluntary basis from [facility] regional emergency room after being found covered in mud in a ditch along the road.  The patient has been using heroin, amphetamines, cocaine, and marijuana.  He has been off of medications and has had increasing psychosis, depression,  suicidal ideation.  I had the patient here in January of 2021 under very similar circumstances.  He has not been following up on outpatient basis.. The patient was positive for cocaine, amphetamines, and marijuana and has been using opiates. He has some opiate withdrawal symptoms. Labs were essentially within normal limits. He had some elevated CK which was alleviated with hydration.  Patient's biggest complaints are feeling "sore all over ".  He can not name any specific stressors.  He has not made any attempts to harm himself.  He has been picking at his skin more intensely and has numerous excoriations.  Patient's drug of choice is opiates and cocaine which he snorts. He has never used IV drugs. He is interested in  a long-term substance abuse treatment. He states that family and friends been worried about him and encouraged him to seek inpatient treatment.      HOSPITAL COURSE:    Patient was admitted to the hospital for stabilization of admission symptoms.  Medicines were instituted and adjusted. Celexa and Depakote. During the stay, the patient did well with medication adjustments and tolerated meds well.  He went through two days of withdrawal with significant GI issues. The patient was enrolled in groups and milieu treatment.  We worked on Radiographer, therapeutic, stress management techniques, and the need for compliance, sobriety, and taking medications appropriately.  Medication side effects were discussed as well.  During the stay, the patient had no violence, agitation, or threatening behavior and required no p.r.n. medications, seclusion, or restraint.  The patient has improved significantly over the course of their stay, and is appropriate for step down to a less restrictive level of care.  The patient agrees with the plan and at the time of departure is not suicidal, homicidal, or psychotic.  I feel that the patient has maximized benefit from inpatient treatment.  Patient provided crisis numbers to call  should symptoms worsen.  This morning, he was unable to speak with provider. Later in the afternoon, the patient became agitated, kicking walls and upset that he could not leave. 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 He states that he cannot miss work or he will be fired. He was seen with attending psychiatrist and charge nurse. He denies SI, HI, and AVH and does not want substance abuse treatment. Explained against medical advice and patient willing to sign paperwork. He asks that prescriptions be printed.   Mental Status Exam: Alert and oriented x4.  Casual dress, calm, well groomed.  No SI/HI/AVH, delusions, or paranoia.  Thoughts are logical, coherent, goal directed.  Good eye contact. Speech is normal rate and tone.  Mood is agitated, but quickly calms and is cooperative, affect congruent.  No psychomotor agitation or psychomotor retardation, no cogwheel rigidity or abnormal movements.  Gait is normal.  Attention is good.  Concentration and memory good.  No cognitive deficits noted.  Judgment fair, insight fair.  Calculation and abstraction are within normal limits.  Condition at discharge:  Patient is stable and improved.  They are not suicidal, homicidal, or psychotic.  DC Diagnosis: Major depressive disorder recurrent severe with acute psychosis, Generalized anxiety disorder, differential diagnosis includes substance-induced mood disorder, cocaine use disorder severe, heroin use disorder severe, opiate use disorder severe, methamphetamine use disorder unspecified, marijuana use disorder severe, noncompliance  Plan:  Patient will follow-up with outpatient provider as scheduled  Time taken on the day of discharge exceeded 30 minutes and included med reconciliation, treatment team staffing, discussion with patient, review of the record, and completion of all documentation.

## 2022-01-23 NOTE — Nurses Notes (Addendum)
7a-7p Nurse Note: patient up ad lib on unit this shift, withdrawn to room. Calm and cooperative with medication administration and assessment. Obvious anxiousness and irritability noted. Patient rates anxiety/depression 6/10, on a scale of 1-10 with 1 being the least and 10 being the worst possible. Denies any SI/HI/AVT hallucinations at this time. Clonidine given, see COWS assessment. Tolerated well. Fair appetite noted, good sleep noted by patient. No acute physical distress noted at this time. No other complaints voiced by patient. Q15 minute and prn safety checks completed by staff per protocol.      12 noon - patient requesting to sign ITL. CN explained to patient that he cannot leave right then, the Dr. Has up to 96 hours. Patient began saying that when he gets to a door he is out, patient then began going to doors in day room and back hall kicking them.

## 2022-01-23 NOTE — Behavioral Health (Signed)
Patient's primary therapist is off today. This counselor informed by provider working this weekend the patient is going to be discharged AMA because the patient is exit seeking, kicking doors and refusing to cooperate with any further treatment. Provider going to print paper prescriptions, patient states he will find his own ride and he will decide if he wants follow-up or not.

## 2022-01-23 NOTE — Nurses Notes (Signed)
Patient leaving AMA. AVS reviewed with patient/care giver.  A written copy of the AVS and discharge instructions was given to the patient/care giver.  Questions sufficiently answered as needed.  Patient/care giver encouraged to follow up with PCP as indicated.  In the event of an emergency, patient/care giver instructed to call 911 or go to the nearest emergency room.

## 2022-01-23 NOTE — Care Plan (Signed)
ANXIETY 6/10, DEPRESSION 3/10.  DENIES SI, HI, AVH.  COOPERATIVE WITH MEDS THIS SHIFT.  VERBALIZES FEELING "BETTER" S/P IVF INFUSION.  VOICES IMPROVING APPETITE.    Problem: Adult Behavioral Health Plan of Care  Goal: Plan of Care Review  Outcome: Ongoing (see interventions/notes)  Flowsheets (Taken 01/22/2022 2000)  Patient Agreement with Plan of Care: agrees  Goal: Patient-Specific Goal (Individualization)  Outcome: Ongoing (see interventions/notes)  Flowsheets (Taken 01/22/2022 2000)  Individualized Care Needs: coping skills  Goal: Adheres to Safety Considerations for Self and Others  Outcome: Ongoing (see interventions/notes)  Intervention: Develop and Maintain Individualized Safety Plan  Recent Flowsheet Documentation  Taken 01/22/2022 2000 by Natale Lay, RN  Safety Measures: safety rounds completed  Goal: Absence of New-Onset Illness or Injury  Outcome: Ongoing (see interventions/notes)  Intervention: Identify and Manage Fall Risk  Recent Flowsheet Documentation  Taken 01/22/2022 2000 by Natale Lay, RN  Safety Measures: safety rounds completed  Intervention: Prevent VTE (Venous Thromboembolism)  Recent Flowsheet Documentation  Taken 01/22/2022 2000 by Natale Lay, RN  VTE Prevention/Management: ambulation promoted  Intervention: Prevent Infection  Recent Flowsheet Documentation  Taken 01/22/2022 2000 by Natale Lay, RN  Infection Prevention:   rest/sleep promoted   promote handwashing  Goal: Optimized Coping Skills in Response to Life Stressors  Outcome: Ongoing (see interventions/notes)  Intervention: Promote Effective Coping Strategies  Recent Flowsheet Documentation  Taken 01/22/2022 2000 by Natale Lay, RN  Supportive Measures:   active listening utilized   positive reinforcement provided   relaxation techniques promoted   self-care encouraged   self-reflection promoted   self-responsibility promoted   verbalization of feelings encouraged  Goal: Develops/Participates in Therapeutic  [place] to Support Successful Transition  Outcome: Ongoing (see interventions/notes)  Intervention: [place] Documentation  Taken 01/22/2022 2000 by Natale Lay, [place] Relationship/Rapport: care explained  Goal: Rounds/Family Conference  Outcome: Ongoing (see interventions/notes)     Problem: Excessive Substance Use  Goal: Optimized Energy Level (Excessive Substance Use)  Outcome: Ongoing (see interventions/notes)  Intervention: Optimize Energy Level  Recent Flowsheet Documentation  Taken 01/22/2022 2000 by Natale Lay, RN  Activity (Behavioral Health):   activity encouraged   up ad lib  Goal: Improved Behavioral Control (Excessive Substance Use)  Outcome: Ongoing (see interventions/notes)     Problem: Adult Treatment Plan  Goal: Patient will meet daily with attending physician.  Description: Physicians will evaluate patient, discuss diagnosis, prescribe medications and order diagnostic testing as clinically indicated. Physicians will monitor safety risks and assess when appropriate for discharge.  Outcome: Ongoing (see interventions/notes)  Goal: Patient will complete psychosocial history assessment within 60 hours of admission with Clinical Therapist.  Outcome: Ongoing (see interventions/notes)  Goal: Patient will meet individually daily with Clinical Therapist.  Outcome: Ongoing (see interventions/notes)  Goal: Patient will deny suicidal/homicidal ideation by day of discharge to Physician.  Outcome: Ongoing (see interventions/notes)  Goal: Patient will participate in 100% of morning affirmation and psychoeducational groups provided daily by Mental Health Specialist.  Outcome: Ongoing (see interventions/notes)  Goal: Patient will participate in 75% of therapy groups facilitated by Clinical Therapist.  Description: Topics will include Dialectical Behavior Therapy with the components of Mindfulness, Distress Tolerance, Emotion Regulation and Crisis Survival Skills,  as well as Stress Management skills.  Outcome: Ongoing (see interventions/notes)  Goal: Patient will participate in 100% of recreational groups provided by Recreational Therapist.  Outcome: Ongoing (see interventions/notes)

## 2022-01-23 NOTE — Progress Notes (Cosign Needed)
[facility] IP PROGRESS NOTE      Nathan Flowers, Nathan Flowers  Date of Admission:  01/20/2022  Date of Birth:  [birth year]  Date of Service:  01/23/2022    Hospital Day:  LOS: 3 days     HPI:  Patient states he is starting to feel better.  Blood pressure still elevated I will increase his Norvasc to 5 mg daily.  He has a headache and I will give him Motrin as needed.  He got 2 L of fluid and he is starting to hold down food better.    Vital Signs:  Temp (24hrs) Max:36.7 C (98 F)      Temperature: 36.5 C (97.7 F)  BP (Non-Invasive): (!) 140/98  Heart Rate: 64  Respiratory Rate: 19  SpO2: 96 %    Current Medications:  acetaminophen (TYLENOL) tablet, 650 mg, Oral, Q4H PRN  aluminum-magnesium hydroxide-simethicone (MAG-AL PLUS) 200-200-20 mg per 5 mL oral liquid, 30 mL, Oral, Q4H PRN  [START ON 01/24/2022] amLODIPine (NORVASC) tablet, 5 mg, Oral, Daily  citalopram (CeleXA) tablet, 10 mg, Oral, Daily  cyclobenzaprine (FLEXERIL) tablet, 10 mg, Oral, 3x/day  dicyclomine (BENTYL) capsule, 10 mg, Oral, 3x/day  divalproex (DEPAKOTE) 12 hr delayed release tablet, 500 mg, Oral, 2x/day  hydrOXYzine pamoate (VISTARIL) capsule, 25 mg, Oral, 3x/day PRN  ibuprofen (MOTRIN) tablet, 400 mg, Oral, Q6H PRN  LORazepam (ATIVAN) 2 mg/mL injection, 2 mg, IntraMUSCULAR, Q6H PRN  magnesium hydroxide (MILK OF MAGNESIA) 400mg  per 9mL oral liquid, 30 mL, Oral, HS PRN  nicotine (NICODERM CQ) transdermal patch (mg/24 hr), 21 mg, Transdermal, Daily  ondansetron (ZOFRAN ODT) rapid dissolve tablet, 4 mg, Oral, Q6H PRN  traZODone (DESYREL) tablet, 50 mg, Oral, HS PRN - MR x 1  ziprasidone (GEODON) IM injection, 20 mg, IntraMUSCULAR, Q12H PRN        Current Orders:  Active Orders   Diet    DIET REGULAR Do you want to initiate MNT Protocol? Yes; Additional modifications/limitations: DOUBLE PORTIONS     Frequency: All Meals     Number of Occurrences: 1 Occurrences   Nursing    BEHAVIORAL MED SAFETY CHECKS     Frequency: Q15MIN     Number  of Occurrences: 999 Occurrences    PT IS LOW RISK FOR VENOUS THROMBOEMBOLISM     Frequency: CONTINUOUS     Number of Occurrences: Until Specified    REFERRAL TO [email or web address] - FOR ENROLLMENT IN FREE, ONLINE GROUP CLASSES FOR TOBACCO CESSATION     Frequency: ONE TIME     Number of Occurrences: 1 Occurrences     Order Comments: Visit [email or web address] to access FREE online modules to take at your own pace or group classes to help you quit tobacco.       Scheduling Instructions:      Visit [email or web address] to access FREE online modules to take at your own pace or group classes to help you quit tobacco.    RESTRICTIONS - NO VISITORS     Frequency: UNTIL DISCONTINUED     Number of Occurrences: Until Specified    TOBACCO CESSATION: I HAVE DISCUSSED REFERRAL FOR TOBACCO CESSATION COUNSELING AND INITIATION OF NICOTINE REPLACMENT THERAPY WITH THIS PATIENT.     Frequency: ONE TIME     Number of Occurrences: 1 Occurrences    VITAL SIGNS  EVERY 12 HRS (0800-2000)     Frequency: EVERY 12 HRS (0800-2000)     Number of Occurrences: Until Specified   Code Status  FULL CODE     Frequency: CONTINUOUS     Number of Occurrences: Until Specified   Consult    IP CONSULT TO HOSPITALIST Requested Provider; Dionne Milo     Frequency: ONE TIME     Number of Occurrences: 1 Occurrences   [facility]    COWS ASSESSMENT     Frequency: ONE TIME     Number of Occurrences: 1 Occurrences   Precaution    BMED SPECIAL PRECAUTIONS OBSERVE EVERY FIFTEEN MINUTES     Frequency: UNTIL DISCONTINUED     Number of Occurrences: Until Specified    LOW RISK SUICIDE PRECAUTIONS (PAVILION)     Frequency: UNTIL DISCONTINUED     Number of Occurrences: Until Specified   Schedule Follow Up Orders    REFERRAL TO [facility] QUITLINE - TOBACCO CESSATION     Frequency: ONE TIME     Number of Occurrences: 1 Occurrences     Order Comments: Please note, placing this order means you have discussed this program with the patient and they are willing to  participate.     Medications    acetaminophen (TYLENOL) tablet     Frequency: Q4H PRN     Dose: 650 mg     Route: Oral    aluminum-magnesium hydroxide-simethicone (MAG-AL PLUS) 200-200-20 mg per 5 mL oral liquid     Frequency: Q4H PRN     Dose: 30 mL     Route: Oral    amLODIPine (NORVASC) tablet     Frequency: Daily     Dose: 5 mg     Route: Oral    citalopram (CeleXA) tablet     Frequency: Daily     Dose: 10 mg     Route: Oral    cloNIDine (CATAPRES) tablet     Frequency: Q4H PRN     Dose: 0.1 mg     Route: Oral    cyclobenzaprine (FLEXERIL) tablet     Frequency: 3x/day     Dose: 10 mg     Route: Oral    dicyclomine (BENTYL) capsule     Frequency: 3x/day     Dose: 10 mg     Route: Oral    divalproex (DEPAKOTE) 12 hr delayed release tablet     Frequency: 2x/day     Dose: 500 mg     Route: Oral    hydrOXYzine pamoate (VISTARIL) capsule     Frequency: 3x/day PRN     Dose: 25 mg     Route: Oral    ibuprofen (MOTRIN) tablet     Frequency: Q6H PRN     Dose: 400 mg     Route: Oral    LORazepam (ATIVAN) 2 mg/mL injection     Frequency: Q6H PRN     Dose: 2 mg     Route: IntraMUSCULAR    magnesium hydroxide (MILK OF MAGNESIA) 400mg  per 54mL oral liquid     Frequency: HS PRN     Dose: 30 mL     Route: Oral    nicotine (NICODERM CQ) transdermal patch (mg/24 hr)     Frequency: Daily     Dose: 21 mg     Route: Transdermal    ondansetron (ZOFRAN ODT) rapid dissolve tablet     Frequency: Q6H PRN     Dose: 4 mg     Route: Oral    traZODone (DESYREL) tablet     Frequency: HS PRN - MR x 1     Dose: 50  mg     Route: Oral    ziprasidone (GEODON) IM injection     Frequency: Q12H PRN     Dose: 20 mg     Route: IntraMUSCULAR        Review of Systems:  Focused review of system was completed. Refer to the HPI for ROS details.     Today's Physical Exam:    General:  Patient is pleasant cooperative  HEENT: NC/AT   eyes  normal  Lungs:  Lungs clear, normal breathing, non-labored  CV: Regular rate and rhythm.   Abd:  non-tender,  soft.  Extremities: no edema, normal appearance, pulses intact  Skin: no rashes, lesions,abscesses. Normal color.  Neuro: No focal deficits. Normal tone.         I/O:  I/O last 24 hours:      Intake/Output Summary (Last 24 hours) at 01/23/2022 1213  Last data filed at 01/22/2022 1445  Gross per 24 hour   Intake 890 ml   Output --   Net 890 ml     I/O current shift:  No intake/output data recorded.    Labs  Please indicate ordered or reviewed)  Reviewed: Lab Results Today:    Results for orders placed or performed during the hospital encounter of 01/20/22 (from the past 24 hour(s))   COMPREHENSIVE METABOLIC PANEL, NON-FASTING   Result Value Ref Range    SODIUM 133 (L) 136 - 145 mmol/L    POTASSIUM 4.6 3.5 - 5.1 mmol/L    CHLORIDE 101 98 - 107 mmol/L    CO2 TOTAL 26 21 - 31 mmol/L    ANION GAP 6 (L) 10 - 20 mmol/L    BUN 14 7 - 25 mg/dL    CREATININE 0.77 0.60 - 1.30 mg/dL    BUN/CREA RATIO 18 6 - 22    ESTIMATED GFR 109 >59 mL/min/1.105m^2    ALBUMIN 4.1 3.5 - 5.7 g/dL    CALCIUM 9.6 8.6 - 10.3 mg/dL    GLUCOSE 112 (H) 74 - 109 mg/dL    ALKALINE PHOSPHATASE 57 34 - 104 U/L    ALT (SGPT) 50 7 - 52 U/L    AST (SGOT) 30 13 - 39 U/L    BILIRUBIN TOTAL 0.6 0.3 - 1.2 mg/dL    PROTEIN TOTAL 7.8 6.4 - 8.9 g/dL    ALBUMIN/GLOBULIN RATIO 1.1 0.8 - 1.4    OSMOLALITY, CALCULATED 268 (L) 270 - 290 mOsm/kg    CALCIUM, CORRECTED 9.5 8.9 - 10.8 mg/dL    GLOBULIN 3.7 2.9 - 5.4   CBC WITH DIFF   Result Value Ref Range    WBCS UNCORRECTED 9.7 x10^3/uL    WBC 9.7 4.0 - 10.5 x10^3/uL    RBC 4.96 4.20 - 6.00 x10^6/uL    HGB 14.6 13.5 - 18.0 g/dL    HCT 41.2 (L) 42.0 - 51.0 %    MCV 83.1 78.0 - 99.0 fL    MCH 29.4 27.0 - 32.0 pg    MCHC 35.4 32.0 - 36.0 g/dL    RDW 13.9 11.6 - 14.8 %    PLATELETS 307 140 - 440 x10^3/uL    MPV 7.7 7.4 - 10.4 fL    NEUTROPHIL % 67 40 - 76 %    LYMPHOCYTE % 23 (L) 25 - 45 %    MONOCYTE % 9 0 - 12 %    EOSINOPHIL % 0 0 - 7 %    BASOPHIL % 0 0 - 3 %  NEUTROPHIL # 6.50 1.80 - 8.40 x10^3/uL    LYMPHOCYTE # 2.20 1.10  - 5.00 x10^3/uL    MONOCYTE # 0.90 0.00 - 1.30 x10^3/uL    EOSINOPHIL # 0.00 0.00 - 0.80 x10^3/uL    BASOPHIL # 0.00 0.00 - 0.30 x10^3/uL          Problem List:  Active Hospital Problems   (*Primary Problem)    Diagnosis   . *MDD (major depressive disorder), recurrent, severe, with psychosis (CMS [facility])   . GAD (generalized anxiety disorder)       Assessment/ Plan:  Patient's dehydration is getting better.  He has hypertension I will increase his Norvasc.  Motrin as needed for headache.  Labs reviewed in his kidney function is back to normal    Arline Asp, PA-C

## 2022-01-23 NOTE — Group Note (Signed)
Group topic:  COMMUNITY MEETING GROUP    Date of group:  01/23/2022  Start time of group:  0850  End time of group:  0900               Summary of group discussion: [employer] , Unit Rules/Guidelines    Patient didn't attend.       Nathan Flowers  is a 51 y.o. male participating in a community meeting group.    Patient observations:      Patient goals:      [name], PAT CARE [facility]  01/23/2022, 10:00

## 2022-01-23 NOTE — Group Note (Signed)
Group topic:  PROCESS THERAPY    Date of group:  01/23/2022  Start time of group:  1000  End time of group:  1100               Summary of group discussion: What to do when conflict arises, how to prevent conflict  Use problem solving skills and coping skills   Discussed scenarios and what the appropriate way to handle things are.   1. Stay calm or walk away until you can remain calm to address the conflict. Don't just react.   2. Avoid a physical altercation   3. Believe there is more to gain by working things out than not. You must decide regarding the situation if you feel there is more to gain.    4. Listen to what the other person has to say. Respectfully, ask them to listen to you as well   5. Try to see how you might be responsible instead of just blaming the other person   6. look for ways to solve the problem rather than just winning the argument   7. Be willing to compromise   8. Avoid saying negative things about the other person, using put downs   9. Speak the truth but do it respectfully   10. Try to put yourself in the other person's shoes instead of only focusing on yourself and your stuff/issues   11. You might be dealing with an internal conflict (conflict within yourself).   12. Do a self-assessment--what is happening right now, how am I feeling right now--is the situation contributing to how I am feeling or could it be something else.    You can always ask the other person/people involved to do the same as what is listed above.   Sometimes you have to determine if the conflict can be resolved or if the other party only wants to continue having conflict.   Sometimes you have to determine if it is best for you to walk away and forget about it.   Conflict can cause [place] to have negative and unhelpful and unhealthy thoughts. Sometimes what people say can be extremely hurtful and hard to deal with.        Did not attend group    Lendon Colonel, [place]  01/23/2022, 11:16

## 2022-01-23 NOTE — Nurses Notes (Signed)
7P-7A NURSE NOTE:  PT ALERT, ORIENTED X 4.  WITHDRAWN TO ROOM IN BED THIS SHIFT.  COOPERATIVE WITH MEDS AND ASSESSMENT.  PT RATES ANXIETY 6/10, DEPRESSION 3/10.  IVF INFUSING PER ORDER, SITE CLEAR.  PT STATES "IM FEELING A LITTLE BETTER SINCE THE FLUIDS STARTED".  PT ATE A CUP OF PUDDING AND DRANK A BOTTLE OF WATER.  DENIES N/V.  DENIES SI, HI, AVH AND PAIN.  DENIES ADDITIONAL NEEDS OR CONCERNS WHEN ASKED.  PT WILL CONTINUE TO BE VISUALLY MONITORED ON SAFETY ROUNDS AND PRN.

## 2022-01-23 NOTE — Discharge Instructions (Signed)
Reason for Admission:  MDD, recurrent, severe, with psychosis, substance abuse    LAB TESTING      Lab Results   Component Value Date    HA1C 6.1 (H) 01/21/2022     No results found for: GLUCOSEFAST  Lab Results   Component Value Date    CHOLESTEROL 101 01/21/2022    LDLCHOL 54 01/21/2022    TRIG 106 01/21/2022    TRIG 106 01/21/2022     Lab Results   Component Value Date/Time    ALBUMIN 4.1 01/23/2022 06:19 AM    TOTALPROTEIN 7.8 01/23/2022 06:19 AM    ALKPHOS 57 01/23/2022 06:19 AM      Lab Results   Component Value Date/Time    AST 30 01/23/2022 06:19 AM    ALT 50 01/23/2022 06:19 AM     Lab Results   Component Value Date/Time    WBC 9.7 01/23/2022 06:19 AM    HGB 14.6 01/23/2022 06:19 AM    HCT 41.2 (L) 01/23/2022 06:19 AM    PLTCNT 307 01/23/2022 06:19 AM     No results found for: COVID19, COVID19PCR, COVID19RES, [id number], [place], [place]  Lab Results   Component Value Date/Time    BUNCRRATIO 18 01/23/2022 06:19 AM      Pended studies: None

## 2022-01-25 ENCOUNTER — Encounter (HOSPITAL_BASED_OUTPATIENT_CLINIC_OR_DEPARTMENT_OTHER): Payer: Self-pay

## 2022-01-26 NOTE — Discharge Summary (Signed)
Late Entry for 01/23/2022, D/C summary completed but attached to progress note of same date.    [facility]   DISCHARGE SUMMARY        PATIENT NAME:  Albert Ward, Albert Ward   MRN:  [id number]  DOB:  11/10/1970    ENCOUNTER DATE:  01/20/2022  INPATIENT ADMISSION DATE: 01/20/2022    DISCHARGE DATE: 01/23/2022    ADMITTING PHYSICIAN: Nolon Rod  PRIMARY CARE PHYSICIAN: No Pcp         Reason for Admission     Diagnosis        Hallucinations [9925]        DISCHARGE DIAGNOSIS: MDD (major depressive disorder), recurrent, severe, with psychosis (CMS [facility])      Hospital Problems    1MDD (major depressive disorder), recurrent, severe, with psychosis (CMS [facility])         Date Noted: 01/21/2022      GAD (generalized anxiety disorder)         Date Noted: 01/21/2022      Resolved Hospital Problems  No resolved problems to display.      There are no active non-hospital problems to display for this patient.    No Known Allergies                                                                                          DISCHARGE MEDICATIONS:     Current Discharge Medication List          CONTINUE these medications - NO CHANGES were made during your visit.      Details   naloxone 4 mg/actuation Spray, Non-Aerosol  Commonly known as: NARCAN   1 Spray, INTRANASAL, EVERY 2 MIN PRN, for actual or suspected opioid overdose. Call 911 if used.  Qty: 2 Each  Refills: 2            DISCHARGE INSTRUCTIONS:   Patient is leaving AMA.  He should follow up with outpatient providers for medication management and will need a Depakote level and platelet count at first appointment. He would benefit from long-term substance abuse treatment and therapy.    REASON FOR HOSPITALIZATION: [name] is a 51 year old white male with a history of depression and anxiety complicated by substance use [facility] a [facility] basis [facility] being found covered in mud in a ditch along the road. The patient has been  using heroin, amphetamines, cocaine, and marijuana. He has been off of medications and has had increasing psychosis, depression, suicidal ideation. I had the patient here in January of 2021 under very similar circumstances. He has not been following up on outpatient basis.. The patient was positive for cocaine, amphetamines,and marijuana and has been using opiates. He has some opiate withdrawal symptoms. Labs were essentially within normal limits. He had some elevated CK which was alleviated with hydration. Patient's biggest complaints are feeling "sore all over ". He can not name any specific stressors. He has not made any attempts to harm himself. Hehas been picking at his skin more intenselyand has numerous excoriations. Patient's drug of choice is opiates and cocaine which he snorts.  He has never used IV drugs. He is interested in a long-term substance abuse treatment. He states that family and friends been worried about him and encouraged him to seek inpatient treatment.     HOSPITAL COURSE:    Patient was admitted to the hospital for stabilization of admission symptoms.  Medicines were instituted and adjusted. Celexa and Depakote. During the stay, the patient did well with medication adjustments and tolerated meds well.  He went through two days of withdrawal with significant GI issues. The patient was enrolled in groups and milieu treatment.  We worked on Radiographer, therapeutic, stress management techniques, and the need for compliance, sobriety, and taking medications appropriately.  Medication side effects were discussed as well.  During the stay, the patient had no violence, agitation, or threatening behavior and required no p.r.n. medications, seclusion, or restraint.  The patient has improved significantly over the course of their stay, and is appropriate for step down to a less restrictive level of care.  The patient agrees with the plan and at the time of departure is not suicidal,  homicidal, or psychotic.  I feel that the patient has maximized benefit from inpatient treatment.  Patient provided crisis numbers to call should symptoms worsen.  This morning, he was unable to speak with provider. Later in the afternoon, the patient became agitated, kicking walls and upset that he could not leave. 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 He states that he cannot miss work or he will be fired. He was seen with attending psychiatrist and charge nurse. He denies SI, HI, and AVH and does not want substance abuse treatment. Explained against medical advice and patient willing to sign paperwork. He asks that prescriptions be printed.   Mental Status Exam: Alert and oriented x4. Casual dress, calm, well groomed. No SI/HI/AVH, delusions, or paranoia. Thoughts are logical, coherent, goal directed. Good eye contact. Speech is normal rate and tone. Mood is agitated, but quickly calms and is cooperative, affect congruent. No psychomotor agitation or psychomotor retardation, no cogwheel rigidity or abnormal movements. Gait is normal. Attention is good. Concentration and memory good. No cognitive deficits noted. Judgment fair, insight fair. Calculation and abstraction are within normal limits.  Condition at discharge:  Patient is stable and improved.  They are not suicidal, homicidal, or psychotic.  DC Diagnosis: Major depressive disorder recurrent severe with acute psychosis, Generalized anxiety disorder, differential diagnosis includessubstance-induced mood disorder, cocaine use disorder severe, heroin use disorder severe, opiate use disorder severe, methamphetamine use disorder unspecified, marijuana use disorder severe, noncompliance  Plan:  Patient will follow-up with outpatient provider as scheduled  Time taken on the day of discharge exceeded 30 minutes and included med  reconciliation, treatment team staffing, discussion with patient, review of the record, and completion of all documentation.

## 2022-02-04 ENCOUNTER — Emergency Department
Admission: EM | Admit: 2022-02-04 | Discharge: 2022-02-04 | Disposition: A | Discharge status: Home / Self Care | Payer: Medicaid Other | Attending: Emergency Medicine | Admitting: Emergency Medicine

## 2022-02-04 ENCOUNTER — Emergency Department (HOSPITAL_BASED_OUTPATIENT_CLINIC_OR_DEPARTMENT_OTHER): Payer: Medicaid Other

## 2022-02-04 ENCOUNTER — Other Ambulatory Visit: Payer: Self-pay

## 2022-02-04 ENCOUNTER — Encounter (HOSPITAL_BASED_OUTPATIENT_CLINIC_OR_DEPARTMENT_OTHER): Payer: Self-pay

## 2022-02-04 DIAGNOSIS — F10129 Alcohol abuse with intoxication, unspecified: Secondary | ICD-10-CM | POA: Insufficient documentation

## 2022-02-04 DIAGNOSIS — F32A Depression, unspecified: Secondary | ICD-10-CM | POA: Insufficient documentation

## 2022-02-04 DIAGNOSIS — E118 Type 2 diabetes mellitus with unspecified complications: Secondary | ICD-10-CM | POA: Insufficient documentation

## 2022-02-04 DIAGNOSIS — Z681 Body mass index (BMI) 19 or less, adult: Secondary | ICD-10-CM

## 2022-02-04 DIAGNOSIS — Z91148 Patient's other noncompliance with medication regimen for other reason: Secondary | ICD-10-CM | POA: Insufficient documentation

## 2022-02-04 DIAGNOSIS — F17210 Nicotine dependence, cigarettes, uncomplicated: Secondary | ICD-10-CM | POA: Insufficient documentation

## 2022-02-04 DIAGNOSIS — F10929 Alcohol use, unspecified with intoxication, unspecified: Secondary | ICD-10-CM

## 2022-02-04 DIAGNOSIS — F1910 Other psychoactive substance abuse, uncomplicated: Secondary | ICD-10-CM | POA: Insufficient documentation

## 2022-02-04 LAB — URINE DRUG SCREEN
AMPHETAMINES URINE: POSITIVE — AB
BARBITURATES URINE: NEGATIVE
BENZODIAZEPINES URINE: NEGATIVE
CANNABINOIDS URINE: POSITIVE — AB
COCAINE METABOLITES URINE: POSITIVE — AB
METHADONE URINE: NEGATIVE
OPIATES URINE: NEGATIVE
PCP URINE: NEGATIVE

## 2022-02-04 LAB — URINALYSIS, MICROSCOPIC: HYALINE CASTS: 5 /lpf — ABNORMAL HIGH (ref <0)

## 2022-02-04 LAB — COMPREHENSIVE METABOLIC PANEL, NON-FASTING
ALBUMIN/GLOBULIN RATIO: 1 (ref 0.8–1.4)
ALBUMIN: 3.8 g/dL (ref 3.4–5.0)
ALKALINE PHOSPHATASE: 79 U/L (ref 46–116)
ALT (SGPT): 89 U/L — ABNORMAL HIGH (ref <=78)
ANION GAP: 13 mmol/L (ref 10–20)
AST (SGOT): 109 U/L — ABNORMAL HIGH (ref 15–37)
BILIRUBIN TOTAL: 0.4 mg/dL (ref 0.2–1.0)
BUN/CREA RATIO: 17
BUN: 25 mg/dL — ABNORMAL HIGH (ref 7–18)
CALCIUM, CORRECTED: 9.2 mg/dL
CALCIUM: 9 mg/dL (ref 8.5–10.1)
CHLORIDE: 108 mmol/L — ABNORMAL HIGH (ref 98–107)
CO2 TOTAL: 23 mmol/L (ref 21–32)
CREATININE: 1.51 mg/dL — ABNORMAL HIGH (ref 0.70–1.30)
ESTIMATED GFR: 56 mL/min/{1.73_m2} — ABNORMAL LOW (ref >59)
GLOBULIN: 4
GLUCOSE: 68 mg/dL — ABNORMAL LOW (ref 74–106)
OSMOLALITY, CALCULATED: 290 mOsm/kg (ref 270–290)
POTASSIUM: 4.5 mmol/L (ref 3.5–5.1)
PROTEIN TOTAL: 7.8 g/dL (ref 6.4–8.2)
SODIUM: 144 mmol/L (ref 136–145)

## 2022-02-04 LAB — URINALYSIS, MACRO/MICRO
BILIRUBIN: NEGATIVE mg/dL
GLUCOSE: NEGATIVE mg/dL
KETONES: NEGATIVE mg/dL
LEUKOCYTES: NEGATIVE WBCs/uL
NITRITE: NEGATIVE
PH: 6 (ref 4.6–8.0)
PROTEIN: 100 mg/dL — AB
SPECIFIC GRAVITY: >=1.03 (ref 1.003–1.035)
UROBILINOGEN: 0.2 mg/dL (ref 0.2–1.0)

## 2022-02-04 LAB — CBC WITH DIFF
BASOPHIL #: 0.1 10*3/uL (ref 0.00–0.30)
BASOPHIL %: 0 % (ref 0–3)
EOSINOPHIL #: 0.07 10*3/uL (ref 0.00–0.80)
EOSINOPHIL %: 0 % (ref 0–7)
HCT: 38.3 % — ABNORMAL LOW (ref 42.0–51.0)
HGB: 12.9 g/dL — ABNORMAL LOW (ref 13.5–18.0)
LYMPHOCYTE #: 1.72 10*3/uL (ref 1.10–5.00)
LYMPHOCYTE %: 8 % — ABNORMAL LOW (ref 25–45)
MCH: 29.1 pg (ref 27.0–32.0)
MCHC: 33.8 g/dL (ref 32.0–36.0)
MCV: 86.2 fL (ref 78.0–99.0)
MONOCYTE #: 2.77 10*3/uL — ABNORMAL HIGH (ref 0.00–1.30)
MONOCYTE %: 13 % — ABNORMAL HIGH (ref 0–12)
MPV: 7.4 fL (ref 7.4–10.4)
NEUTROPHIL #: 15.88 10*3/uL — ABNORMAL HIGH (ref 1.80–8.40)
NEUTROPHIL %: 77 % — ABNORMAL HIGH (ref 40–76)
PLATELETS: 365 10*3/uL (ref 140–440)
RBC: 4.44 10*6/uL (ref 4.20–6.00)
RDW: 15.7 % — ABNORMAL HIGH (ref 11.6–14.8)
WBC: 20.5 10*3/uL — ABNORMAL HIGH (ref 4.0–10.5)

## 2022-02-04 LAB — ETHANOL, SERUM: ETHANOL: <3 mg/dL (ref <=3)

## 2022-02-04 MED ORDER — LORAZEPAM 2 MG/ML INJECTION WRAPPER
2.0000 mg | INTRAMUSCULAR | Status: AC
Start: 2022-02-04 — End: 2022-02-04
  Administered 2022-02-04: 2 mg via INTRAVENOUS

## 2022-02-04 MED ORDER — DEXTROSE 50 % IN WATER (D50W) INTRAVENOUS SYRINGE
INJECTION | INTRAVENOUS | Status: AC
Start: 2022-02-04 — End: 2022-02-04
  Filled 2022-02-04: qty 50

## 2022-02-04 MED ORDER — DEXTROSE 50 % IN WATER (D50W) INTRAVENOUS SYRINGE
25.0000 g | INJECTION | INTRAVENOUS | Status: AC
Start: 2022-02-04 — End: 2022-02-04
  Administered 2022-02-04: 50 mL via INTRAVENOUS

## 2022-02-04 MED ORDER — LACTATED RINGERS IV BOLUS
1000.0000 mL | INJECTION | Status: AC
Start: 2022-02-04 — End: 2022-02-04
  Administered 2022-02-04: 1000 mL via INTRAVENOUS
  Administered 2022-02-04: 0 mL via INTRAVENOUS

## 2022-02-04 MED ORDER — LORAZEPAM 2 MG/ML INJECTION SYRINGE
INJECTION | INTRAMUSCULAR | Status: AC
Start: 2022-02-04 — End: 2022-02-04
  Filled 2022-02-04: qty 1

## 2022-02-04 NOTE — ED Nurses Note (Signed)
Patient resting in bed.  Pulse ox and blood pressure in place.

## 2022-02-04 NOTE — ED Triage Notes (Signed)
PER EMS-Police called EMS for them to do evaluation on pt, whom was feeling bad, altered (4-4-6 GCS). PT admitted to drinking last nt and smoking thc and possibly meth. Police had picked pt up while rolling in mud and called EMS. Blood glucose 51 per EMS. Denies any SI/HI.

## 2022-02-04 NOTE — ED Attending Note (Signed)
[place] emergency department         HISTORY OF PRESENT ILLNESS     Date:  02/04/2022  Patient's Name:  Albert Ward  Date of Birth:  1971-05-05    Patient is a 51 year old on the police custody brought to the emergency room by EMS.  Patient apparently recently arrested and in jail found to be confused shaking all over and EMS was called to the police station to evaluate patient.  Apparently patient is a diabetic and has been noncompliant with medication EMS states his blood sugar was 51.  EMS also state the patient stated that he has been drinking alcohol smoking marijuana and meth.  In the emergency room he is awake and alert disheveled dirty apparently has been rolling in mud when he was arrested.  I reviewed the patient's past medical records showed the patient has a history of depression and drug abuse.  Presently patient denies being suicidal or homicidal.  He denies having auditory or visual hallucinations.          Review of Systems     Review of Systems   HENT: Negative.    Respiratory: Negative.    Cardiovascular: Negative.    Gastrointestinal: Negative.    Musculoskeletal: Negative.    Skin: Positive for pallor.   Neurological: Positive for dizziness.   Psychiatric/Behavioral: Positive for confusion.   All other systems reviewed and are negative.      Previous History     Past Medical History:  Past Medical History:   Diagnosis Date   . Drug use        Past Surgical History:  Past Surgical History:   Procedure Laterality Date   . Hx hernia repair     . Hx hernia repair Left        Social History:  Social History     Tobacco Use   . Smoking status: Every Day     Packs/day: 2.00     Years: 15.00     Pack years: 30.00     Types: Cigarettes     Passive exposure: Current   . Smokeless tobacco: Never   Vaping Use   . Vaping Use: Never used   Substance Use Topics   . Alcohol use: Not Currently     Comment: 1 day per week   . Drug use: Yes     Frequency: 5.0 times per week      Types: Amphetamine, Cocaine, Methamphetamines, Heroin, Marijuana     Social History     Substance and Sexual Activity   Drug Use Yes   . Frequency: 5.0 times per week   . Types: Amphetamine, Cocaine, Methamphetamines, Heroin, Marijuana       Family History:  No family history on file.    Medication History:  Current Outpatient Medications   Medication Sig   . amLODIPine (NORVASC) 5 mg Oral Tablet Take 1 Tablet (5 mg total) by mouth Once a day   . citalopram (CELEXA) 10 mg Oral Tablet Take 1 Tablet (10 mg total) by mouth Once a day   . divalproex (DEPAKOTE) 500 mg Oral Tablet, Delayed Release (E.C.) Take 1 Tablet (500 mg total) by mouth Twice daily   . naloxone (NARCAN) 4 mg per spray nasal spray 1 Spray by INTRANASAL Ward Every 2 minutes as needed for actual or suspected opioid overdose. Call 911 if used.   . traZODone (DESYREL) 50 mg Oral Tablet Take 1 Tablet (50  mg total) by mouth Every night as needed for Insomnia       Allergies:  No Known Allergies    Physical Exam     Vitals:    BP (!) 157/97   Pulse 92   Temp 36.6 C (97.9 F)   Resp 12   Ht 1.854 m (6\' 1" )   Wt 68 kg (150 lb)   SpO2 96%   BMI 19.79 kg/m           Physical Exam  Vitals and nursing note reviewed.   Constitutional:       General: He is not in acute distress.     Appearance: Normal appearance. He is well-developed.   HENT:      Head: Normocephalic and atraumatic.      Mouth/Throat:      Pharynx: Oropharynx is clear.   Eyes:      Conjunctiva/sclera: Conjunctivae normal.   Cardiovascular:      Rate and Rhythm: Normal rate and regular rhythm.      Pulses: Normal pulses.      Heart sounds: No murmur heard.  Pulmonary:      Effort: Pulmonary effort is normal. No respiratory distress.      Breath sounds: Normal breath sounds.   Abdominal:      General: Bowel sounds are normal.      Palpations: Abdomen is soft.      Tenderness: There is no abdominal tenderness.   Musculoskeletal:         General: No swelling. Normal range of motion.       Cervical back: Normal range of motion and neck supple.   Skin:     General: Skin is warm and dry.      Capillary Refill: Capillary refill takes less than 2 seconds.      Comments: Patient covered in mud and dirt head-to-toe   Neurological:      General: No focal deficit present.      Mental Status: He is alert and oriented to person, place, and time.   Psychiatric:         Mood and Affect: Mood normal.         Diagnostic Studies/Treatment     Medications:  Medications Administered in the ED   LR bolus infusion 1,000 mL (has no administration in time range)       New Prescriptions    No medications on file       Labs:    No results found for this or any previous visit (from the past 12 hour(s)).     Radiology:  None    No orders to display       ECG:  NONE            Differential diagnosis      Course/Disposition/Plan     Course:        Disposition:    Discharged    Condition at Disposition:       Follow up:   No follow-up provider specified.    Clinical Impression:     Clinical Impression   Polysubstance abuse (CMS [facility]) (Primary)   Alcohol intoxication (CMS [place])         Winfred Burn, MD

## 2022-02-04 NOTE — ED Attending Handoff Note (Signed)
[facility], [facility] Emergency Department  Emergency Department  Course Note    Care/report received from Dr. Scarlett Presto, Ophelia Shoulder, MD at  07:48.  Per report:  Albert Ward is a 51 y.o. male who had concerns including Drug Overdose and Altered Mental Status.  Patient claims that smokes some weed it might have had something else in it but he does not know what it was.  Patient stated that he thought it was meth that he took early in the morning.    Pending labs/imaging/consults:  Awaiting the rest of patient's labs.  Awaiting patient give [place] urine so we can see was drug screen looks like.  Plan:  The plan is to get his labs back and trying to extinguish when he took last night or early this morning.  Patient is under police custody.  Patient will be going to jail once it is known what he is under.    Course:  Patient has been lying comfortably in bed twitching back and forth but alert.  Patient is writhing all over the bed hitting himself.  Patient is also picking at his skin constantly.  Patient's repeat blood sugar was 61.  Patient got an amp of D50 as well as 2 mg of Ativan IV.     Disposition; discharged    Clinical Impression   Polysubstance abuse (CMS HCC) (Primary)   Alcohol intoxication (CMS HCC)         Ammie Ferrier, MD

## 2022-02-04 NOTE — ED Nurses Note (Signed)
Patient son contacted Albert Ward and he stated he could not come and get the patient as he was getting ready to go to work.

## 2022-02-04 NOTE — ED Nurses Note (Signed)
Blood sugar 59 at this time.  Patient rolling around in bed.  Cardiac monitor in place.

## 2022-02-04 NOTE — ED Nurses Note (Signed)
Patient resting in bed.

## 2022-02-04 NOTE — ED Nurses Note (Signed)
Patient given a blue scrub top as patient had no top on upon arrival.  Patient also given grip socks upon discharge.  Patient ambulated out of er. Patient was alert and oriented

## 2022-02-04 NOTE — ED Nurses Note (Signed)
Patient sitting up in bed eating and drinking at this time.  Patient easily falls asleep.

## 2022-02-04 NOTE — ED Nurses Note (Signed)
Patient awake states he is ready to go home.  Patient ask where his boots were - explained to patient that he did not come into er with boots and that his personal belongings were given to [place] by police and that it would be given to him by security.

## 2022-02-06 LAB — URINE CULTURE,ROUTINE: URINE CULTURE: NO GROWTH

## 2022-03-08 ENCOUNTER — Other Ambulatory Visit (HOSPITAL_COMMUNITY): Payer: Self-pay

## 2022-05-26 ENCOUNTER — Other Ambulatory Visit (HOSPITAL_COMMUNITY): Payer: Self-pay

## 2022-05-31 ENCOUNTER — Other Ambulatory Visit (HOSPITAL_COMMUNITY): Payer: Self-pay

## 2022-05-31 ENCOUNTER — Other Ambulatory Visit: Payer: Self-pay | Admitting: Infectious Disease

## 2022-05-31 DIAGNOSIS — B20 Human immunodeficiency virus [HIV] disease: Secondary | ICD-10-CM

## 2022-05-31 NOTE — Telephone Encounter (Signed)
Patient schedule appointment for 11/14

## 2022-06-01 ENCOUNTER — Other Ambulatory Visit: Payer: Self-pay

## 2022-06-01 ENCOUNTER — Ambulatory Visit: Payer: BC Managed Care – PPO | Admitting: Infectious Disease

## 2022-06-01 ENCOUNTER — Other Ambulatory Visit (HOSPITAL_COMMUNITY): Payer: Self-pay

## 2022-06-01 ENCOUNTER — Encounter: Payer: Self-pay | Admitting: Infectious Disease

## 2022-06-01 ENCOUNTER — Other Ambulatory Visit (HOSPITAL_COMMUNITY)
Admission: RE | Admit: 2022-06-01 | Discharge: 2022-06-01 | Disposition: A | Discharge status: Home / Self Care | Payer: BC Managed Care – PPO | Source: Ambulatory Visit | Attending: Infectious Disease | Admitting: Infectious Disease

## 2022-06-01 VITALS — BP 124/82 | HR 71 | Temp 97.0°F | Ht 71.0 in | Wt 266.0 lb

## 2022-06-01 DIAGNOSIS — E785 Hyperlipidemia, unspecified: Secondary | ICD-10-CM | POA: Diagnosis not present

## 2022-06-01 DIAGNOSIS — D225 Melanocytic nevi of trunk: Secondary | ICD-10-CM | POA: Insufficient documentation

## 2022-06-01 DIAGNOSIS — L813 Cafe au lait spots: Secondary | ICD-10-CM | POA: Insufficient documentation

## 2022-06-01 DIAGNOSIS — L570 Actinic keratosis: Secondary | ICD-10-CM | POA: Insufficient documentation

## 2022-06-01 DIAGNOSIS — B20 Human immunodeficiency virus [HIV] disease: Secondary | ICD-10-CM | POA: Insufficient documentation

## 2022-06-01 DIAGNOSIS — Z23 Encounter for immunization: Secondary | ICD-10-CM | POA: Diagnosis not present

## 2022-06-01 DIAGNOSIS — Z79899 Other long term (current) drug therapy: Secondary | ICD-10-CM | POA: Diagnosis not present

## 2022-06-01 DIAGNOSIS — E669 Obesity, unspecified: Secondary | ICD-10-CM

## 2022-06-01 DIAGNOSIS — L578 Other skin changes due to chronic exposure to nonionizing radiation: Secondary | ICD-10-CM | POA: Insufficient documentation

## 2022-06-01 DIAGNOSIS — E66812 Obesity, class 2: Secondary | ICD-10-CM

## 2022-06-01 DIAGNOSIS — L814 Other melanin hyperpigmentation: Secondary | ICD-10-CM | POA: Insufficient documentation

## 2022-06-01 MED ORDER — PITAVASTATIN MAGNESIUM 4 MG PO TABS
4.0000 mg | ORAL_TABLET | Freq: Every day | ORAL | 11 refills | Status: DC
  Filled 2022-06-01: qty 30, 30d supply, fill #0
  Filled 2022-06-08: qty 90, 90d supply, fill #0

## 2022-06-01 MED ORDER — BIKTARVY 50-200-25 MG PO TABS
1.0000 | ORAL_TABLET | Freq: Every day | ORAL | 3 refills | Status: DC
  Filled 2022-06-01: qty 90, 90d supply, fill #0
  Filled 2022-08-24: qty 90, 90d supply, fill #1
  Filled 2022-11-11: qty 90, 90d supply, fill #2
  Filled 2023-02-09: qty 90, 90d supply, fill #3

## 2022-06-01 NOTE — Progress Notes (Signed)
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 Time he was tried initially on Sustiva anTruvada t but was changed to Kaletra and Combivir I believe for better CNS penetration of the second regimen. In any case he responded well to that regimen and responded well to kaletra epzicom and then Prezista Norvir and epzicom and has had undetectable viral loads for years.   We changed him last to  2 tivicay and epzico and  then changed to [place].  He continues to maintain perfect virological suppression.   He is now on [facility] and has 40 pounds since starting it.  We discussed the results of the reprieve study and I will attempt to prescribe pitavastatin though we may have to use alternative statin.       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   HIV infection ([place])    Nuclear sclerotic cataract of both eyes 11/06/2019   White coat syndrome with hypertension 03/27/2021    Past Surgical History:  Procedure Laterality Date   APPENDECTOMY      Family History  Problem Relation Age of Onset   Cancer Father    Hyperlipidemia Father    Mental illness Brother    Diabetes Maternal Grandmother    Hyperlipidemia Maternal Grandfather    Hypertension Maternal Grandfather    Stroke Maternal Grandfather    Cancer Maternal Grandfather    Heart disease Paternal Grandmother    Hyperlipidemia Paternal Grandmother    Hypertension Paternal Grandmother    Stroke Paternal Grandmother    Cancer Paternal Grandfather       Social History   Socioeconomic History   Marital status: Single    Spouse name: Not on file   Number of children: Not  on file   Years of education: Not on file   Highest education level: Not on file  Occupational History   Not on file  Tobacco Use   Smoking status: Never   Smokeless tobacco: Never  Substance and Sexual Activity   Alcohol use: No   Drug use: No   Sexual activity: Yes    Partners: Male    Comment: declined condoms  Other Topics Concern   Not on file  Social History Narrative   Not on file   Social Determinants of Health   Financial Resource Strain: Not on file  Food Insecurity: Not on file  Transportation Needs: Not on file  Physical Activity: Not on file  Stress: Not on file  Social Connections: Not on file    Allergies  Allergen Reactions   Penicillins     REACTION: Reaction unknown; remote     Current Outpatient Medications:    bictegravir-emtricitabine-tenofovir AF (BIKTARVY) 50-200-25 MG TABS tablet, Take 1 tablet by mouth daily., Disp: 90 tablet, Rfl: 3   prednisoLONE acetate (PRED FORTE) 1 % ophthalmic suspension, Place 1 drop into the right eye 4 (four) times daily. (Patient not taking: Reported on 06/01/2022), Disp: 5 mL, Rfl: 0  Review of Systems  Constitutional:  Negative for activity change, appetite change, chills, diaphoresis, fatigue, fever and unexpected weight change.  HENT:  Negative for congestion, rhinorrhea,  sinus pressure, sneezing, sore throat and trouble swallowing.   Eyes:  Negative for photophobia and visual disturbance.  Respiratory:  Negative for cough, chest tightness, shortness of breath, wheezing and stridor.   Cardiovascular:  Negative for chest pain, palpitations and leg swelling.  Gastrointestinal:  Negative for abdominal distention, abdominal pain, anal bleeding, blood in stool, constipation, diarrhea, nausea and vomiting.  Genitourinary:  Negative for difficulty urinating, dysuria, flank pain and hematuria.  Musculoskeletal:  Negative for arthralgias, back pain, gait problem, joint swelling and myalgias.  Skin:  Negative for color  change, pallor, rash and wound.  Neurological:  Negative for dizziness, tremors, weakness and light-headedness.  Hematological:  Negative for adenopathy. Does not bruise/bleed easily.  Psychiatric/Behavioral:  Negative for agitation, behavioral problems, confusion, decreased concentration, dysphoric mood and sleep disturbance.        Objective:   Physical Exam Constitutional:      Appearance: He is well-developed.  HENT:     Head: Normocephalic and atraumatic.  Eyes:     Conjunctiva/sclera: Conjunctivae normal.  Cardiovascular:     Rate and Rhythm: Normal rate and regular rhythm.  Pulmonary:     Effort: Pulmonary effort is normal. No respiratory distress.     Breath sounds: No wheezing.  Abdominal:     General: There is no distension.     Palpations: Abdomen is soft.  Musculoskeletal:        General: No tenderness. Normal range of motion.     Cervical back: Normal range of motion and neck supple.  Skin:    General: Skin is warm and dry.     Coloration: Skin is not pale.     Findings: No erythema or rash.  Neurological:     General: No focal deficit present.     Mental Status: He is alert and oriented to person, place, and time.  Psychiatric:        Mood and Affect: Mood normal.        Behavior: Behavior normal.        Thought Content: Thought content normal.        Judgment: Judgment normal.           Assessment & Plan:   HIV disease:  I will add order HIV viral load CD4 count CBC with differential CMP, RPR GC and chlamydia and I will continue  [employer] prescription   Obesity: He is making considerable progress with 40 pounds of weight loss with the [place]   Hyperipidemia as mentioned will initiate a statin preferably pitavastatin Will check lipid panel as well   Counseling recommended flu and COVID-19 he received flu shot today we will comfortably getting COVID-19 booster when he has time off from work the next day.

## 2022-06-02 ENCOUNTER — Other Ambulatory Visit (HOSPITAL_COMMUNITY): Payer: Self-pay

## 2022-06-02 LAB — URINE CYTOLOGY ANCILLARY ONLY
Chlamydia: NEGATIVE
Comment: NEGATIVE
Comment: NORMAL
Neisseria Gonorrhea: NEGATIVE

## 2022-06-02 LAB — T-HELPER CELLS (CD4) COUNT (NOT AT ARMC)
CD4 % Helper T Cell: 27 % — ABNORMAL LOW (ref 33–65)
CD4 T Cell Abs: 469 /uL (ref 400–1790)

## 2022-06-03 LAB — RPR: RPR Ser Ql: NONREACTIVE

## 2022-06-03 LAB — COMPLETE METABOLIC PANEL WITH GFR
AG Ratio: 1.8 (calc) (ref 1.0–2.5)
ALT: 23 U/L (ref 9–46)
AST: 23 U/L (ref 10–35)
Albumin: 4.7 g/dL (ref 3.6–5.1)
Alkaline phosphatase (APISO): 63 U/L (ref 35–144)
BUN: 14 mg/dL (ref 7–25)
CO2: 31 mmol/L (ref 20–32)
Calcium: 9.8 mg/dL (ref 8.6–10.3)
Chloride: 100 mmol/L (ref 98–110)
Creat: 1.05 mg/dL (ref 0.70–1.30)
Globulin: 2.6 g/dL (calc) (ref 1.9–3.7)
Glucose, Bld: 76 mg/dL (ref 65–99)
Potassium: 4.1 mmol/L (ref 3.5–5.3)
Sodium: 139 mmol/L (ref 135–146)
Total Bilirubin: 0.6 mg/dL (ref 0.2–1.2)
Total Protein: 7.3 g/dL (ref 6.1–8.1)
eGFR: 86 mL/min/{1.73_m2} (ref >=60)

## 2022-06-03 LAB — CBC WITH DIFFERENTIAL/PLATELET
Absolute Monocytes: 558 cells/uL (ref 200–950)
Basophils Absolute: 60 cells/uL (ref 0–200)
Basophils Relative: 1 %
Eosinophils Absolute: 120 cells/uL (ref 15–500)
Eosinophils Relative: 2 %
HCT: 45 % (ref 38.5–50.0)
Hemoglobin: 16.2 g/dL (ref 13.2–17.1)
Lymphs Abs: 2004 cells/uL (ref 850–3900)
MCH: 33.1 pg — ABNORMAL HIGH (ref 27.0–33.0)
MCHC: 36 g/dL (ref 32.0–36.0)
MCV: 91.8 fL (ref 80.0–100.0)
MPV: 10.6 fL (ref 7.5–12.5)
Monocytes Relative: 9.3 %
Neutro Abs: 3258 cells/uL (ref 1500–7800)
Neutrophils Relative %: 54.3 %
Platelets: 327 10*3/uL (ref 140–400)
RBC: 4.9 10*6/uL (ref 4.20–5.80)
RDW: 13.5 % (ref 11.0–15.0)
Total Lymphocyte: 33.4 %
WBC: 6 10*3/uL (ref 3.8–10.8)

## 2022-06-03 LAB — LIPID PANEL
Cholesterol: 131 mg/dL (ref <200)
HDL: 41 mg/dL (ref >=40)
LDL Cholesterol (Calc): 76 mg/dL (calc)
Non-HDL Cholesterol (Calc): 90 mg/dL (calc) (ref <130)
Total CHOL/HDL Ratio: 3.2 (calc) (ref <5.0)
Triglycerides: 68 mg/dL (ref <150)

## 2022-06-03 LAB — HIV-1 RNA QUANT-NO REFLEX-BLD
HIV 1 RNA Quant: NOT DETECTED Copies/mL
HIV-1 RNA Quant, Log: NOT DETECTED Log cps/mL

## 2022-06-04 ENCOUNTER — Other Ambulatory Visit (HOSPITAL_COMMUNITY): Payer: Self-pay

## 2022-06-08 ENCOUNTER — Other Ambulatory Visit (HOSPITAL_COMMUNITY): Payer: Self-pay

## 2022-06-09 ENCOUNTER — Other Ambulatory Visit (HOSPITAL_COMMUNITY): Payer: Self-pay

## 2022-07-08 DIAGNOSIS — B20 Human immunodeficiency virus [HIV] disease: Secondary | ICD-10-CM | POA: Diagnosis not present

## 2022-07-08 DIAGNOSIS — R030 Elevated blood-pressure reading, without diagnosis of hypertension: Secondary | ICD-10-CM | POA: Diagnosis not present

## 2022-07-08 DIAGNOSIS — E038 Other specified hypothyroidism: Secondary | ICD-10-CM | POA: Diagnosis not present

## 2022-07-22 ENCOUNTER — Encounter (INDEPENDENT_AMBULATORY_CARE_PROVIDER_SITE_OTHER): Payer: BC Managed Care – PPO | Admitting: Ophthalmology

## 2022-07-31 ENCOUNTER — Other Ambulatory Visit (HOSPITAL_COMMUNITY): Payer: Self-pay

## 2022-07-31 MED ORDER — PAXLOVID (300/100) 20 X 150 MG & 10 X 100MG PO TBPK
ORAL_TABLET | ORAL | 5 refills | Status: DC
  Filled 2022-07-31: qty 30, 5d supply, fill #0

## 2022-08-24 ENCOUNTER — Other Ambulatory Visit (HOSPITAL_COMMUNITY): Payer: Self-pay

## 2022-11-11 ENCOUNTER — Other Ambulatory Visit (HOSPITAL_COMMUNITY): Payer: Self-pay

## 2022-11-16 ENCOUNTER — Other Ambulatory Visit (HOSPITAL_COMMUNITY): Payer: Self-pay

## 2022-11-17 ENCOUNTER — Other Ambulatory Visit (HOSPITAL_COMMUNITY): Payer: Self-pay

## 2023-01-25 ENCOUNTER — Encounter (HOSPITAL_BASED_OUTPATIENT_CLINIC_OR_DEPARTMENT_OTHER): Payer: Self-pay

## 2023-01-25 ENCOUNTER — Other Ambulatory Visit: Payer: Self-pay

## 2023-01-25 ENCOUNTER — Emergency Department
Admission: EM | Admit: 2023-01-25 | Discharge: 2023-01-25 | Disposition: A | Discharge status: Home / Self Care | Payer: BLUE CROSS/BLUE SHIELD | Attending: NURSE PRACTITIONER | Admitting: NURSE PRACTITIONER

## 2023-01-25 DIAGNOSIS — R509 Fever, unspecified: Secondary | ICD-10-CM | POA: Insufficient documentation

## 2023-01-25 DIAGNOSIS — R519 Headache, unspecified: Secondary | ICD-10-CM | POA: Insufficient documentation

## 2023-01-25 DIAGNOSIS — R21 Rash and other nonspecific skin eruption: Secondary | ICD-10-CM | POA: Insufficient documentation

## 2023-01-25 DIAGNOSIS — R110 Nausea: Secondary | ICD-10-CM | POA: Insufficient documentation

## 2023-01-25 DIAGNOSIS — Z792 Long term (current) use of antibiotics: Secondary | ICD-10-CM

## 2023-01-25 LAB — COMPREHENSIVE METABOLIC PANEL, NON-FASTING
ALBUMIN/GLOBULIN RATIO: 0.8 (ref 0.8–1.4)
ALBUMIN: 3.7 g/dL (ref 3.4–5.0)
ALKALINE PHOSPHATASE: 80 U/L (ref 46–116)
ALT (SGPT): 114 U/L — ABNORMAL HIGH (ref <=78)
ANION GAP: 9 mmol/L (ref 4–13)
AST (SGOT): 69 U/L — ABNORMAL HIGH (ref 15–37)
BILIRUBIN TOTAL: 0.7 mg/dL (ref 0.2–1.0)
BUN/CREA RATIO: 13
BUN: 13 mg/dL (ref 7–18)
CALCIUM, CORRECTED: 9 mg/dL
CALCIUM: 8.8 mg/dL (ref 8.5–10.1)
CHLORIDE: 100 mmol/L (ref 98–107)
CO2 TOTAL: 28 mmol/L (ref 21–32)
CREATININE: 1.03 mg/dL (ref 0.70–1.30)
ESTIMATED GFR: 88 mL/min/{1.73_m2} (ref >59)
GLOBULIN: 4.7
GLUCOSE: 107 mg/dL — ABNORMAL HIGH (ref 74–106)
OSMOLALITY, CALCULATED: 274 mOsm/kg (ref 270–290)
POTASSIUM: 4 mmol/L (ref 3.5–5.1)
PROTEIN TOTAL: 8.4 g/dL — ABNORMAL HIGH (ref 6.4–8.2)
SODIUM: 137 mmol/L (ref 136–145)

## 2023-01-25 LAB — CBC WITH DIFF
BASOPHIL #: 0.01 10*3/uL (ref 0.00–0.30)
BASOPHIL %: 0 % (ref 0–3)
EOSINOPHIL #: 0.02 10*3/uL (ref 0.00–0.80)
EOSINOPHIL %: 1 % (ref 0–7)
HCT: 44.3 % (ref 42.0–51.0)
HGB: 14.9 g/dL (ref 13.5–18.0)
LYMPHOCYTE #: 0.64 10*3/uL — ABNORMAL LOW (ref 1.10–5.00)
LYMPHOCYTE %: 16 % — ABNORMAL LOW (ref 25–45)
MCH: 29.2 pg (ref 27.0–32.0)
MCHC: 33.5 g/dL (ref 32.0–36.0)
MCV: 87.1 fL (ref 78.0–99.0)
MONOCYTE #: 0.52 10*3/uL (ref 0.00–1.30)
MONOCYTE %: 13 % — ABNORMAL HIGH (ref 0–12)
MPV: 7.7 fL (ref 7.4–10.4)
NEUTROPHIL #: 2.89 10*3/uL (ref 1.80–8.40)
NEUTROPHIL %: 71 % (ref 40–76)
PLATELETS: 160 10*3/uL (ref 140–440)
RBC: 5.09 10*6/uL (ref 4.20–6.00)
RDW: 17 % — ABNORMAL HIGH (ref 11.6–14.8)
WBC: 4.1 10*3/uL (ref 4.0–10.5)

## 2023-01-25 LAB — CREATINE KINASE (CK), TOTAL, SERUM OR PLASMA: CREATINE KINASE: 48 U/L (ref 39–308)

## 2023-01-25 LAB — LACTIC ACID LEVEL W/ REFLEX FOR LEVEL >2.0: LACTIC ACID: 1.2 mmol/L (ref 0.4–2.0)

## 2023-01-25 MED ORDER — SODIUM CHLORIDE 0.9 % INTRAVENOUS PIGGYBACK
100.0000 mg | INTRAVENOUS | Status: AC
Start: 2023-01-25 — End: 2023-01-25
  Administered 2023-01-25: 100 mg via INTRAVENOUS
  Administered 2023-01-25: 0 mg via INTRAVENOUS

## 2023-01-25 MED ORDER — SODIUM CHLORIDE 0.9 % INTRAVENOUS PIGGYBACK
INJECTION | INTRAVENOUS | Status: AC
Start: 2023-01-25 — End: 2023-01-25
  Filled 2023-01-25: qty 100

## 2023-01-25 MED ORDER — SODIUM CHLORIDE 0.9 % (FLUSH) INJECTION SYRINGE
3.0000 mL | INJECTION | Freq: Three times a day (TID) | INTRAMUSCULAR | Status: DC
Start: 2023-01-25 — End: 2023-01-25

## 2023-01-25 MED ORDER — DOXYCYCLINE HYCLATE 100 MG INTRAVENOUS POWDER FOR SOLUTION
INTRAVENOUS | Status: AC
Start: 2023-01-25 — End: 2023-01-25
  Filled 2023-01-25: qty 10

## 2023-01-25 MED ORDER — SODIUM CHLORIDE 0.9 % (FLUSH) INJECTION SYRINGE
3.0000 mL | INJECTION | INTRAMUSCULAR | Status: DC | PRN
Start: 2023-01-25 — End: 2023-01-25

## 2023-01-25 MED ORDER — DOXYCYCLINE HYCLATE 100 MG CAPSULE
100.0000 mg | ORAL_CAPSULE | Freq: Two times a day (BID) | ORAL | 0 refills | Status: AC
Start: 2023-01-25 — End: 2023-02-08

## 2023-01-25 MED ORDER — SODIUM CHLORIDE 0.9 % IV BOLUS
1000.0000 mL | INJECTION | Status: AC
Start: 2023-01-25 — End: 2023-01-25
  Administered 2023-01-25: 1000 mL via INTRAVENOUS
  Administered 2023-01-25: 0 mL via INTRAVENOUS

## 2023-01-25 NOTE — Discharge Instructions (Addendum)
Family Practice Offices    [facility] [phone]    [facility] [phone]    [facility]-Dr. Jana Peters [phone]    [facility]-Dr. Richard Shorter [phone]    [facility]-Dr. Glasscock [phone]    [facility]-Dr. Pamela Faulkner [phone]    [facility]-Dr. Ryan Runyon [phone]    [facility]- [phone]    [facility] [phone]    [facility] -[phone]    [facility]-Dr. Eric McClanahan [phone]    [facility] -Dr. Ladonna Bowling [phone]    [facility]-  [phone]

## 2023-01-25 NOTE — ED Triage Notes (Signed)
Patient reports abscess to right chest. Patient states he noticed it four days ago, and it has since gotten worse. Patient states "something bit me."

## 2023-01-25 NOTE — ED Provider Notes (Signed)
[facility]  Emergency Department  Advanced Practice Provider Note      CHIEF COMPLAINT  Chief Complaint   Patient presents with    Abscess     HISTORY OF PRESENT ILLNESS  Albert Ward, date of birth 1970-08-24, is a 52 y.o. male who presented to the Emergency Department.    Patient is a 52 year old male, who presents to the ED with complaint of "abscess" to the right side.  Patient reports rash first started 3 or 4 days ago but has progressively worsened.  Patient denies any pain or itching.  Patient does have a circular, bull's-eye rash to the right side.  Patient also reports headache, body aches and fever.  Patient denies any treatment prior to ER arrival.  Patient is denying any dizziness or syncope.  Denies any chest pain or palpitations.  Denies any cough or shortness of breath.  Does report some slight nausea and poor appetite but denies any vomiting or diarrhea.      PAST MEDICAL/SURGICAL/FAMILY/SOCIAL HISTORY  Past Medical History:   Diagnosis Date    Drug use        Past Surgical History:   Procedure Laterality Date    HX HERNIA REPAIR      HX HERNIA REPAIR Left        Family Medical History:    None       Social History     Socioeconomic History    Marital status: Single   Tobacco Use    Smoking status: Every Day     Current packs/day: 2.00     Average packs/day: 2.0 packs/day for 15.0 years (30.0 ttl pk-yrs)     Types: Cigarettes     Passive exposure: Current    Smokeless tobacco: Never   Vaping Use    Vaping status: Never Used   Substance and Sexual Activity    Alcohol use: Not Currently     Comment: 1 day per week    Drug use: Yes     Frequency: 5.0 times per week     Types: Marijuana   Social History Narrative    ** Merged History Encounter **           ALLERGIES  No Known Allergies        PHYSICAL EXAM  VITAL SIGNS:  Filed Vitals:    01/25/23 1347   BP: (!) 132/94   Pulse: (!) 130   Resp: 17   Temp: 36.3 C (97.3 F)   SpO2: 97%     Constitutional: Awake. Well appearing. Average body  weight. No distress noted.   Cardiovascular: Regular rate. S1, S2 with no murmur or gallop heard. No swelling to extremities  Pulmonary/Chest: Breath sounds clear and equal bilaterally. No wheezes, rales or chest tenderness. No respiratory distress.   Abdominal: Bowel sound normal. Abdomen soft, no tenderness, rebound or guarding.      Musculoskeletal: No tenderness or deformity. Normal muscle tone and strength.   Skin: warm and dry. Bulls-eye rash to R side. See picture below. No drainage noted  Psychiatric: normal mood and affect. Behavior is normal.   Neurological: Alert, oriented. Normal gait. No focal weakness noted. No sensory deficit    Nursing notes reviewed.        Media Information    Document Information    Photographic Image: [email or web address]   [place] Rash   01/25/2023 14:01   Attached To:   Hospital Encounter on 01/25/23   Source Information    Aengus Sauceda,  Dondi Burandt, FNP-BC  Prn Blfd Ed       DIAGNOSTICS  Labs:  Labs listed below were reviewed and interpreted by me.  Results for orders placed or performed during the hospital encounter of 01/25/23   COMPREHENSIVE METABOLIC PANEL, NON-FASTING   Result Value Ref Range    SODIUM 137 136 - 145 mmol/L    POTASSIUM 4.0 3.5 - 5.1 mmol/L    CHLORIDE 100 98 - 107 mmol/L    CO2 TOTAL 28 21 - 32 mmol/L    ANION GAP 9 4 - 13 mmol/L    BUN 13 7 - 18 mg/dL    CREATININE 1.61 0.96 - 1.30 mg/dL    BUN/CREA RATIO 13     ESTIMATED GFR 88 >59 mL/min/1.19m^2    ALBUMIN 3.7 3.4 - 5.0 g/dL    CALCIUM 8.8 8.5 - 04.5 mg/dL    GLUCOSE 409 (H) 74 - 106 mg/dL    ALKALINE PHOSPHATASE 80 46 - 116 U/L    ALT (SGPT) 114 (H) <=78 U/L    AST (SGOT) 69 (H) 15 - 37 U/L    BILIRUBIN TOTAL 0.7 0.2 - 1.0 mg/dL    PROTEIN TOTAL 8.4 (H) 6.4 - 8.2 g/dL    ALBUMIN/GLOBULIN RATIO 0.8 0.8 - 1.4    OSMOLALITY, CALCULATED 274 270 - 290 mOsm/kg    CALCIUM, CORRECTED 9.0 mg/dL    GLOBULIN 4.7    CREATINE KINASE (CK), TOTAL, SERUM   Result Value Ref Range    CREATINE KINASE 48 39 - 308 U/L   LACTIC ACID LEVEL W/  REFLEX FOR LEVEL >2.0   Result Value Ref Range    LACTIC ACID 1.2 0.4 - 2.0 mmol/L   CBC WITH DIFF   Result Value Ref Range    WBC 4.1 4.0 - 10.5 x10^3/uL    RBC 5.09 4.20 - 6.00 x10^6/uL    HGB 14.9 13.5 - 18.0 g/dL    HCT 81.1 91.4 - 78.2 %    MCV 87.1 78.0 - 99.0 fL    MCH 29.2 27.0 - 32.0 pg    MCHC 33.5 32.0 - 36.0 g/dL    RDW 95.6 (H) 21.3 - 14.8 %    PLATELETS 160 140 - 440 x10^3/uL    MPV 7.7 7.4 - 10.4 fL    NEUTROPHIL % 71 40 - 76 %    LYMPHOCYTE % 16 (L) 25 - 45 %    MONOCYTE % 13 (H) 0 - 12 %    EOSINOPHIL % 1 0 - 7 %    BASOPHIL % 0 0 - 3 %    NEUTROPHIL # 2.89 1.80 - 8.40 x10^3/uL    LYMPHOCYTE # 0.64 (L) 1.10 - 5.00 x10^3/uL    MONOCYTE # 0.52 0.00 - 1.30 x10^3/uL    EOSINOPHIL # 0.02 0.00 - 0.80 x10^3/uL    BASOPHIL # 0.01 0.00 - 0.30 x10^3/uL     Radiology:       ED COURSE/MEDICAL DECISION MAKING      ED Course as of 01/25/23 1538   Tue Jan 25, 2023   1433 WBC: 4.1  Normal. PMNs 71   1449 LACTIC ACID: 1.2  Normal   1537 COMPREHENSIVE METABOLIC PANEL, NON-FASTING(!)  Electrolytes normal. Renal function normal      Medical Decision Making  Patient is a 52 year old male, who presents to the ED with complaint of "abscess" to the right side.  Patient reports rash first started 3 or 4 days ago but has progressively worsened.  Patient denies any pain or itching.  Patient does have a circular, bull's-eye rash to the right side.  Patient also reports headache, body aches and fever.  Patient denies any treatment prior to ER arrival.  Patient is denying any dizziness or syncope.  Denies any chest pain or palpitations.  Denies any cough or shortness of breath.  Does report some slight nausea and poor appetite but denies any vomiting or diarrhea.    List of differential diagnosis includes but is not limited to abscess, cellulitis, urticaria, Lyme, influenza, COVID, sepsis    Patient's white count is normal at 4.1 and neutrophils 71.  Patient's lactic acid is normal at 1.2.  Patient's renal function is normal.   Patient's electrolytes are normal.  Patient's liver enzymes are elevated with total bilirubin 0.7, ALT 114, AST 69 and alk phos 80.  Patient unsure if he has ever been diagnosed with liver disease.  Strongly encouraged patient to follow up with his PCP to have liver enzymes rechecked and addressed.  Patient verbalizes understanding    Patient will be discharged with prescription for doxycycline, Lyme test pending.  Patient given strict return to ED precautions    Following the above history, physical exam, and findings- the patient was deemed stable and suitable for discharge.  Discharge and medication instructions were discussed with the patient and all questions were addressed.  The patient understands that they may return to the ED at any time for new or worsening symptoms or if they have any other concerns.  The patient is to follow up with PCP . Patient verbalized understanding and agrees to plan of care       Amount and/or Complexity of Data Reviewed  Labs: ordered.  ECG/medicine tests: independent interpretation performed.    Risk  Prescription drug management.      CLINICAL IMPRESSION  Clinical Impression   Rash and nonspecific skin eruption (Primary)   Prophylactic antibiotic     DISPOSITION  Discharged       DISCHARGE MEDICATIONS  Current Discharge Medication List        START taking these medications    Details   doxycycline hyclate (VIBRAMYCIN) 100 mg Oral Capsule Take 1 Capsule (100 mg total) by mouth Twice daily for 14 days  Qty: 28 Capsule, Refills: 0             Sherlie Ban, FNP-C 01/25/2023, 13:55   [facility]  Department of Emergency Medicine  [facility]    This note was partially generated using MModal Fluency Direct system, and there may be some incorrect words, spellings, and punctuation that were not noted in checking the note before saving.    -----

## 2023-01-27 ENCOUNTER — Other Ambulatory Visit (HOSPITAL_COMMUNITY): Payer: Self-pay

## 2023-01-27 LAB — LYME ANTIBODY PANEL WITH REFLEX: LYME ANTIBODY TOTAL (Screen): NEGATIVE

## 2023-01-28 NOTE — ED Nurses Note (Signed)
[facility], [facility] Emergency Department  Peer Recovery Coach Assessment    Initial Evaluation  Referred by:: Nurse  Location of Evaluation: Emergency Department  How many times in the last 12 months have you been to the ED?: 6 or more  Have you ever served or are you currently serving in the Armed Forces?: No             Substance Use History  Patient current substance use status: Patient stated he smokes marijuana daily.    Prior treatment history?: No    Currently enrolled in substance use program?: No    Within the last 30 days, what substances has the patient used?: Marijuana  Patient's age at first substance use?: 12-15    Has the patient ever had sustained abstinence?: No              Family, Social, Home & Safety History  Marital Status: Single            Need to improve relationships with family?: No    Social network: Immediate family, Substance using peers, Non-substance using peers/friends/other    Current living situation: Independent  Any help needed with the following?: None  Contact phone number for the patient: [phone]       Has the patient had any legal issues within the past 30 days?: None         Employment  Current employment status: Unemployed    Diplomatic Services operational officer?: No  Needs assistance with job search?: No    Engagement  Readiness ruler: 1  Summary of assessment priority areas: Other    Brief Intervention  Discussed plan to reduce/quit substance use?: Yes  Discussed willingness to enter treatment?: Yes  Indicated patient's stage of change:: 1 - Precontemplation    Patient seen by Peer Recovery Coach and is a candidate for buprenorphine administration in the ED. Patient needs assessment for bup treatment.: No (Non opioid user)    Plan  Was the patient referred to treatment?: No    Was patient referred to physician for Buprenorphine Assessment in the ED?: No (Non opioid user)    Did patient receive Narcan in the ED?: No         Follow-up  Patient  admitted for treatment?: No        Need for additional follow-up?: No  Additional comments: Safe use and long-term affects were discussed.    Haynes Bast, Peer Recovery Coach 01/28/2023 09:08

## 2023-02-07 ENCOUNTER — Other Ambulatory Visit: Payer: Self-pay

## 2023-02-09 ENCOUNTER — Other Ambulatory Visit: Payer: Self-pay

## 2023-02-10 ENCOUNTER — Other Ambulatory Visit (HOSPITAL_COMMUNITY): Payer: Self-pay

## 2023-03-23 ENCOUNTER — Ambulatory Visit (INDEPENDENT_AMBULATORY_CARE_PROVIDER_SITE_OTHER): Payer: BC Managed Care – PPO | Admitting: Infectious Disease

## 2023-03-23 ENCOUNTER — Other Ambulatory Visit (HOSPITAL_COMMUNITY)
Admission: RE | Admit: 2023-03-23 | Discharge: 2023-03-23 | Disposition: A | Discharge status: Home / Self Care | Payer: BC Managed Care – PPO | Source: Ambulatory Visit | Attending: Infectious Disease | Admitting: Infectious Disease

## 2023-03-23 ENCOUNTER — Other Ambulatory Visit: Payer: Self-pay

## 2023-03-23 ENCOUNTER — Other Ambulatory Visit (HOSPITAL_COMMUNITY): Payer: Self-pay

## 2023-03-23 ENCOUNTER — Encounter: Payer: Self-pay | Admitting: Infectious Disease

## 2023-03-23 VITALS — BP 133/84 | HR 76 | Temp 97.5°F | Ht 68.0 in | Wt 190.0 lb

## 2023-03-23 DIAGNOSIS — Z6831 Body mass index (BMI) 31.0-31.9, adult: Secondary | ICD-10-CM

## 2023-03-23 DIAGNOSIS — B20 Human immunodeficiency virus [HIV] disease: Secondary | ICD-10-CM | POA: Insufficient documentation

## 2023-03-23 DIAGNOSIS — E785 Hyperlipidemia, unspecified: Secondary | ICD-10-CM | POA: Diagnosis present

## 2023-03-23 DIAGNOSIS — Z23 Encounter for immunization: Secondary | ICD-10-CM

## 2023-03-23 DIAGNOSIS — E669 Obesity, unspecified: Secondary | ICD-10-CM | POA: Diagnosis not present

## 2023-03-23 MED ORDER — BIKTARVY 50-200-25 MG PO TABS
1.0000 | ORAL_TABLET | Freq: Every day | ORAL | 3 refills | Status: DC
  Filled 2023-03-23 – 2023-05-04 (×2): qty 90, 90d supply, fill #0
  Filled 2023-08-01: qty 90, 90d supply, fill #1
  Filled 2023-10-13: qty 90, 90d supply, fill #2
  Filled 2024-01-26: qty 90, 90d supply, fill #3

## 2023-03-23 MED ORDER — PITAVASTATIN MAGNESIUM 4 MG PO TABS: 4.0000 mg | ORAL_TABLET | Freq: Every day | ORAL | 11 refills | Status: DC

## 2023-03-23 NOTE — Progress Notes (Signed)
Chief complaint follow-up for HIV disease on medications    Patient ID: Tommy Galvan, male    DOB: 1970-12-17, 52 y.o.   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 Time he was tried initially on Sustiva anTruvada t but was changed to Kaletra and Combivir I believe for better CNS penetration of the second regimen. In any case he responded well to that regimen and responded well to kaletra epzicom and then Prezista Norvir and epzicom and has had undetectable viral loads for years.   We changed him last to  2 tivicay and epzico and  then changed to TRIUMEQ --> BIKTARVY.  Started on [place] and has 40 pounds since starting it.  He continues to be on this medication and he is continue to keep the weight off.  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   HIV infection (HCC)    Nuclear sclerotic cataract of both eyes 11/06/2019   White coat syndrome with hypertension 03/27/2021    Past Surgical History:  Procedure Laterality Date   APPENDECTOMY      Family History  Problem Relation Age of Onset   Cancer Father    Hyperlipidemia Father    Mental illness Brother    Diabetes Maternal Grandmother    Hyperlipidemia Maternal Grandfather    Hypertension Maternal Grandfather    Stroke Maternal Grandfather    Cancer Maternal Grandfather    Heart disease Paternal Grandmother    Hyperlipidemia Paternal Grandmother    Hypertension Paternal Grandmother    Stroke Paternal Grandmother    Cancer Paternal Grandfather       Social History   Socioeconomic History   Marital status: Single    Spouse name: Not on file   Number of children: Not on file   Years of education: Not on file   Highest  education level: Not on file  Occupational History   Not on file  Tobacco Use   Smoking status: Never   Smokeless tobacco: Never  Substance and Sexual Activity   Alcohol use: No   Drug use: No   Sexual activity: Yes    Partners: Male    Comment: declined condoms  Other Topics Concern   Not on file  Social History Narrative   Not on file   Social Determinants of Health   Financial Resource Strain: Not on file  Food Insecurity: Not on file  Transportation Needs: Not on file  Physical Activity: Not on file  Stress: Not on file  Social Connections: Not on file    Allergies  Allergen Reactions   Penicillins     REACTION: Reaction unknown; remote     Current Outpatient Medications:    bictegravir-emtricitabine-tenofovir AF (BIKTARVY) 50-200-25 MG TABS tablet, Take 1 tablet by mouth daily., Disp: 90 tablet, Rfl: 3   MOUNJARO 2.5 MG/0.5ML Pen, Inject into the skin once a week., Disp: , Rfl:    nirmatrelvir & ritonavir (PAXLOVID, 300/100,) 20 x 150 MG & 10 x 100MG  TBPK, Take as directed as in package., Disp: 30 tablet, Rfl: 5   Pitavastatin Magnesium 4 MG TABS, Take1 tablet (4 mg) by mouth daily., Disp: 30 tablet, Rfl: 11  prednisoLONE acetate (PRED FORTE) 1 % ophthalmic suspension, Place 1 drop into the right eye 4 (four) times daily. (Patient not taking: Reported on 06/01/2022), Disp: 5 mL, Rfl: 0  Review of Systems  Constitutional:  Negative for activity change, appetite change, chills, diaphoresis, fatigue, fever and unexpected weight change.  HENT:  Negative for congestion, rhinorrhea, sinus pressure, sneezing, sore throat and trouble swallowing.   Eyes:  Negative for photophobia and visual disturbance.  Respiratory:  Negative for cough, chest tightness, shortness of breath, wheezing and stridor.   Cardiovascular:  Negative for chest pain, palpitations and leg swelling.  Gastrointestinal:  Negative for abdominal distention, abdominal pain, anal bleeding, blood in stool,  constipation, diarrhea, nausea and vomiting.  Genitourinary:  Negative for difficulty urinating, dysuria, flank pain and hematuria.  Musculoskeletal:  Negative for arthralgias, back pain, gait problem, joint swelling and myalgias.  Skin:  Negative for color change, pallor, rash and wound.  Neurological:  Negative for dizziness, tremors, weakness and light-headedness.  Hematological:  Negative for adenopathy. Does not bruise/bleed easily.  Psychiatric/Behavioral:  Negative for agitation, behavioral problems, confusion, decreased concentration, dysphoric mood and sleep disturbance.        Objective:   Physical Exam Constitutional:      Appearance: He is well-developed.  HENT:     Head: Normocephalic and atraumatic.  Eyes:     Conjunctiva/sclera: Conjunctivae normal.  Cardiovascular:     Rate and Rhythm: Normal rate and regular rhythm.  Pulmonary:     Effort: Pulmonary effort is normal. No respiratory distress.     Breath sounds: No wheezing.  Abdominal:     General: There is no distension.     Palpations: Abdomen is soft.  Musculoskeletal:        General: No tenderness. Normal range of motion.     Cervical back: Normal range of motion and neck supple.  Skin:    General: Skin is warm and dry.     Coloration: Skin is not pale.     Findings: No erythema or rash.  Neurological:     General: No focal deficit present.     Mental Status: He is alert and oriented to person, place, and time.  Psychiatric:        Mood and Affect: Mood normal.        Behavior: Behavior normal.        Thought Content: Thought content normal.        Judgment: Judgment normal.           Assessment & Plan:   HIV disease:  I will add order HIV viral load CD4 count CBC with differential CMP, RPR GC and chlamydia and I will continue  [employer] prescription  Hyperlipidemia; will continue pitavastatin that has been affordable at pharmacy in [place]  Obesity: Continue on  [facility]  Vaccine counseling: recommended updated flu vaccine today, COVID 19 when able

## 2023-03-24 LAB — LIPID PANEL
Cholesterol: 115 mg/dL (ref <200)
HDL: 59 mg/dL (ref >=40)
LDL Cholesterol (Calc): 42 mg/dL
Non-HDL Cholesterol (Calc): 56 mg/dL (ref <130)
Total CHOL/HDL Ratio: 1.9 (calc) (ref <5.0)
Triglycerides: 65 mg/dL (ref <150)

## 2023-03-24 LAB — CBC WITH DIFFERENTIAL/PLATELET
Absolute Monocytes: 576 {cells}/uL (ref 200–950)
Basophils Absolute: 48 {cells}/uL (ref 0–200)
Basophils Relative: 0.8 %
Eosinophils Absolute: 108 {cells}/uL (ref 15–500)
Eosinophils Relative: 1.8 %
HCT: 43.7 % (ref 38.5–50.0)
Hemoglobin: 15.4 g/dL (ref 13.2–17.1)
Lymphs Abs: 2016 {cells}/uL (ref 850–3900)
MCH: 33.3 pg — ABNORMAL HIGH (ref 27.0–33.0)
MCHC: 35.2 g/dL (ref 32.0–36.0)
MCV: 94.4 fL (ref 80.0–100.0)
MPV: 10.4 fL (ref 7.5–12.5)
Monocytes Relative: 9.6 %
Neutro Abs: 3252 {cells}/uL (ref 1500–7800)
Neutrophils Relative %: 54.2 %
Platelets: 254 10*3/uL (ref 140–400)
RBC: 4.63 10*6/uL (ref 4.20–5.80)
RDW: 12.5 % (ref 11.0–15.0)
Total Lymphocyte: 33.6 %
WBC: 6 10*3/uL (ref 3.8–10.8)

## 2023-03-24 LAB — COMPLETE METABOLIC PANEL WITH GFR
AG Ratio: 1.8 (calc) (ref 1.0–2.5)
ALT: 29 U/L (ref 9–46)
AST: 24 U/L (ref 10–35)
Albumin: 4.8 g/dL (ref 3.6–5.1)
Alkaline phosphatase (APISO): 64 U/L (ref 35–144)
BUN: 14 mg/dL (ref 7–25)
CO2: 31 mmol/L (ref 20–32)
Calcium: 10.1 mg/dL (ref 8.6–10.3)
Chloride: 102 mmol/L (ref 98–110)
Creat: 1 mg/dL (ref 0.70–1.30)
Globulin: 2.6 g/dL (ref 1.9–3.7)
Glucose, Bld: 67 mg/dL (ref 65–99)
Potassium: 4.3 mmol/L (ref 3.5–5.3)
Sodium: 140 mmol/L (ref 135–146)
Total Bilirubin: 0.7 mg/dL (ref 0.2–1.2)
Total Protein: 7.4 g/dL (ref 6.1–8.1)
eGFR: 91 mL/min/{1.73_m2} (ref >=60)

## 2023-03-24 LAB — HIV-1 RNA QUANT-NO REFLEX-BLD
HIV 1 RNA Quant: NOT DETECTED {copies}/mL
HIV-1 RNA Quant, Log: NOT DETECTED {Log_copies}/mL

## 2023-03-24 LAB — T-HELPER CELLS (CD4) COUNT (NOT AT ARMC)
CD4 % Helper T Cell: 29 % — ABNORMAL LOW (ref 33–65)
CD4 T Cell Abs: 548 /uL (ref 400–1790)

## 2023-03-24 LAB — RPR: RPR Ser Ql: NONREACTIVE

## 2023-03-25 LAB — URINE CYTOLOGY ANCILLARY ONLY
Chlamydia: NEGATIVE
Comment: NEGATIVE
Comment: NORMAL
Neisseria Gonorrhea: NEGATIVE

## 2023-05-04 ENCOUNTER — Other Ambulatory Visit (HOSPITAL_COMMUNITY): Payer: Self-pay | Admitting: Pharmacy Technician

## 2023-05-04 ENCOUNTER — Other Ambulatory Visit (HOSPITAL_COMMUNITY): Payer: Self-pay

## 2023-05-04 ENCOUNTER — Other Ambulatory Visit: Payer: Self-pay

## 2023-05-04 NOTE — Progress Notes (Signed)
Specialty Pharmacy Refill Coordination Note  Tommy Galvan is a 52 y.o. male contacted today regarding refills of specialty medication(s) Bictegravir-Emtricitab-Tenofov   Patient requested [name] at [facility] at [place] date: 05/13/23   Medication will be filled on 05/12/23.

## 2023-05-12 ENCOUNTER — Other Ambulatory Visit: Payer: Self-pay

## 2023-05-13 ENCOUNTER — Other Ambulatory Visit (HOSPITAL_COMMUNITY): Payer: Self-pay

## 2023-06-07 ENCOUNTER — Other Ambulatory Visit: Payer: Self-pay | Admitting: Infectious Disease

## 2023-06-15 ENCOUNTER — Emergency Department
Admission: EM | Admit: 2023-06-15 | Discharge: 2023-06-15 | Disposition: A | Discharge status: Home / Self Care | Payer: BLUE CROSS/BLUE SHIELD | Attending: Emergency Medicine | Admitting: Emergency Medicine

## 2023-06-15 ENCOUNTER — Other Ambulatory Visit: Payer: Self-pay

## 2023-06-15 DIAGNOSIS — K047 Periapical abscess without sinus: Secondary | ICD-10-CM | POA: Insufficient documentation

## 2023-06-15 MED ORDER — PENICILLIN V POTASSIUM 500 MG TABLET
500.0000 mg | ORAL_TABLET | Freq: Three times a day (TID) | ORAL | 0 refills | Status: AC
Start: 2023-06-15 — End: 2023-06-25

## 2023-06-15 MED ORDER — HYDROCODONE 5 MG-ACETAMINOPHEN 325 MG TABLET
1.0000 | ORAL_TABLET | Freq: Four times a day (QID) | ORAL | 0 refills | Status: DC | PRN
Start: 2023-06-15 — End: 2023-12-20

## 2023-06-15 MED ORDER — AMOXICILLIN 875 MG-POTASSIUM CLAVULANATE 125 MG TABLET
1.0000 | ORAL_TABLET | ORAL | Status: AC
Start: 2023-06-15 — End: 2023-06-15
  Administered 2023-06-15: 1 via ORAL

## 2023-06-15 MED ORDER — AMOXICILLIN 875 MG-POTASSIUM CLAVULANATE 125 MG TABLET
ORAL_TABLET | ORAL | Status: AC
Start: 2023-06-15 — End: 2023-06-15
  Filled 2023-06-15: qty 1

## 2023-06-15 NOTE — ED Nurses Note (Signed)
Pt reports right lower jaw dental pain, ongoing for "a long period of time".

## 2023-06-15 NOTE — ED Nurses Note (Signed)
Patient discharged home with family.  Encouraged pt to stay after med administration, pt and family member states he has taken med before and comfortable going home.  AVS reviewed with patient/care giver.  A written copy of the AVS and discharge instructions was given to the patient/care giver. Scripts called to pharmacy of choice. Questions sufficiently answered as needed.  Patient/care giver encouraged to follow up with PCP as indicated.  In the event of an emergency, patient/care giver instructed to call 911 or go to the nearest emergency room.

## 2023-06-15 NOTE — ED Provider Notes (Signed)
CHIEF COMPLAINT  Chief Complaint   Patient presents with    Toothache     HISTORY OF PRESENT ILLNESS  JAYME BRUMLOW, date of birth 1970/08/18, is a 52 y.o. male who presented to the Emergency Department complain of right mandibular tooth pain.  It is waxing and waning for several years occasionally he gets infected.  Currently feels fluctuance he feels it is a takes the pus in his mouth.    PAST MEDICAL/SURGICAL/FAMILY/SOCIAL HISTORY  Past Medical History:   Diagnosis Date    Drug use        Past Surgical History:   Procedure Laterality Date    HX HERNIA REPAIR      HX HERNIA REPAIR Left        Family Medical History:    None       Social History     Socioeconomic History    Marital status: Single   Tobacco Use    Smoking status: Every Day     Current packs/day: 2.00     Average packs/day: 2.0 packs/day for 15.0 years (30.0 ttl pk-yrs)     Types: Cigarettes     Passive exposure: Current    Smokeless tobacco: Never   Vaping Use    Vaping status: Never Used   Substance and Sexual Activity    Alcohol use: Not Currently     Comment: 1 day per week    Drug use: Yes     Frequency: 5.0 times per week     Types: Marijuana   Social History Narrative    ** Merged History Encounter **           ALLERGIES  No Known Allergies    PHYSICAL EXAM  VITAL SIGNS:  Filed Vitals:    06/15/23 2011   BP: (!) 145/103   Pulse: 90   Resp: 20   Temp: 37.1 C (98.7 F)   SpO2: 100%     GENERAL: PATIENT IS ALERT AND ORIENTED TO PERSON, PLACE, AND TIME.  IN NO DISTRESS  HEAD: NORMOCEPHALIC AND ATRAUMATIC.  EYES: PUPILS EQUALLY ROUND AND REACT TO LIGHT. EXTRAOCULAR MOVEMENTS INTACT.  EARS: GROSS HEARING INTACT. EXTERNAL EARS WITHIN NORMAL LIMITS.  THROAT: MOIST ORAL MUCOSA. NO ERYTHEMA OR EXUDATE OF THE PHARYNX.  Very poor dentition there was a dental abscess in the right mandible socket.  NECK: SUPPLE. TRACHEA MIDLINE.  NO LYMPHADENOPATHY  EXTREMITIES: N NO GROSS DEFORMITIES, MOVES ALL 4 EXTREMITIES  SKIN: WARM AND DRY.  NEUROLOGIC: CRANIAL  NERVES II THROUGH XII ARE GROSSLY INTACT ALTHOUGH NOT INDIVIDUALLY TESTED.  No gross motor deficits  PSYCHIATRIC: JUDGMENT AND INSIGHT ARE SEEMINGLY INTACT. MOOD AND AFFECT ARE APPROPRIATE FOR THE SITUATION.    PROCEDURES    DIAGNOSTICS  Labs:  Labs listed below were reviewed and interpreted by me.  No results found for any visits on 06/15/23.  Radiology:       ED COURSE/MEDICAL DECISION MAKING  Medications Administered in the ED   amoxicillin-clavulanate (AUGMENTIN) 875-125mg  per tablet (has no administration in time range)          Medical Decision Making  Differential includes abscess, cellulitis, osteomyelitis, oropharyngeal cancer    Risk  Prescription drug management.      CRITICAL CARE    CLINICAL IMPRESSION  Clinical Impression   Dental infection (Primary)     DISPOSITION  Discharged       DISCHARGE MEDICATIONS  Current Discharge Medication List        START taking these  medications    Details   HYDROcodone-acetaminophen (NORCO) 5-325 mg Oral Tablet Take 1 Tablet by mouth Every 6 hours as needed for Pain  Qty: 12 Tablet, Refills: 0      penicillin V potassium (VEETID) 500 mg Oral Tablet Take 1 Tablet (500 mg total) by mouth Three times a day for 10 days  Qty: 30 Tablet, Refills: 0             Gala Romney Kinnie Scales M.D.   06/15/2023, 20:26   [facility]  Department of Emergency Medicine  [facility]    This note was partially generated using MModal Fluency Direct system, and there may be some incorrect words, spellings, and punctuation that were not noted in checking the note before saving.    -----

## 2023-06-15 NOTE — ED Triage Notes (Signed)
Patient states he has had a tooth ache for four years and is unable to see a dentist.  Patient's cousin says he has been having chest pain, abdominal pain, heart failure, and has been losing consciousness.  Patient only complains of tooth pain.

## 2023-06-15 NOTE — Discharge Instructions (Signed)
See a dentist as soon as possible

## 2023-08-01 ENCOUNTER — Other Ambulatory Visit: Payer: Self-pay

## 2023-08-01 NOTE — Progress Notes (Signed)
 Specialty Pharmacy Ongoing Clinical Assessment Note  Tommy Galvan is a 53 y.o. male who is being followed by the specialty pharmacy service for RxSp HIV   Patient's specialty medication(s) reviewed today: Bictegravir-Emtricitab-Tenofov (Biktarvy )   Missed doses in the last 4 weeks: 0   Patient/Caregiver did not have any additional questions or concerns.   Therapeutic benefit summary: Patient is achieving benefit (Last OV 03/23/23, HIV RNA Not Detected)   Adverse events/side effects summary: No adverse events/side effects   Patient's therapy is appropriate to: Continue    Goals Addressed             This Visit's Progress    Achieve Undetectable HIV Viral Load < 20       Patient is on track. Patient will maintain adherence         Follow up:  6 months  Mitzie GORMAN [facility] Specialty Pharmacist

## 2023-08-01 NOTE — Progress Notes (Signed)
 Specialty Pharmacy Refill Coordination Note  Tommy Galvan is a 53 y.o. male contacted today regarding refills of specialty medication(s) Bictegravir-Emtricitab-Tenofov (Biktarvy )   Patient requested [name] at [facility] at [place] date: 08/03/23   Medication will be filled on 08/02/23.

## 2023-08-02 ENCOUNTER — Other Ambulatory Visit: Payer: Self-pay

## 2023-08-09 ENCOUNTER — Other Ambulatory Visit (HOSPITAL_COMMUNITY): Payer: Self-pay

## 2023-10-13 ENCOUNTER — Other Ambulatory Visit: Payer: Self-pay

## 2023-10-13 ENCOUNTER — Other Ambulatory Visit: Payer: Self-pay | Admitting: Pharmacy Technician

## 2023-10-13 NOTE — Progress Notes (Signed)
 Specialty Pharmacy Refill Coordination Note  Tommy Galvan is a 53 y.o. male contacted today regarding refills of specialty medication(s) Bictegravir-Emtricitab-Tenofov Susanne Borders)   Patient requested (Patient-Rptd) Pickup at [facility] at [place] date: 10/17/23   Medication will be filled on 10/17/2023.

## 2023-10-17 ENCOUNTER — Other Ambulatory Visit: Payer: Self-pay

## 2023-11-15 NOTE — Progress Notes (Signed)
 The ASCVD Risk score (Arnett DK, et al., 2019) failed to calculate for the following reasons:   The valid total cholesterol range is 130 to 320 mg/dL  Arlon Bergamo, BSN, RN

## 2023-12-20 ENCOUNTER — Other Ambulatory Visit: Payer: Self-pay

## 2023-12-20 ENCOUNTER — Encounter (HOSPITAL_BASED_OUTPATIENT_CLINIC_OR_DEPARTMENT_OTHER): Payer: Self-pay

## 2023-12-20 ENCOUNTER — Emergency Department
Admission: EM | Admit: 2023-12-20 | Discharge: 2023-12-20 | Disposition: A | Discharge status: Home / Self Care | Attending: Family | Admitting: Family

## 2023-12-20 DIAGNOSIS — K029 Dental caries, unspecified: Secondary | ICD-10-CM | POA: Insufficient documentation

## 2023-12-20 DIAGNOSIS — K0889 Other specified disorders of teeth and supporting structures: Secondary | ICD-10-CM

## 2023-12-20 MED ORDER — KETOROLAC 60 MG/2 ML INTRAMUSCULAR SOLUTION
60.0000 mg | INTRAMUSCULAR | Status: AC
Start: 2023-12-20 — End: 2023-12-20
  Administered 2023-12-20: 60 mg via INTRAMUSCULAR

## 2023-12-20 MED ORDER — KETOROLAC 60 MG/2 ML INTRAMUSCULAR SOLUTION
INTRAMUSCULAR | Status: AC
Start: 2023-12-20 — End: 2023-12-20
  Filled 2023-12-20: qty 2

## 2023-12-20 MED ORDER — LIDOCAINE HCL 10 MG/ML (1 %) INJECTION SOLUTION
INTRAMUSCULAR | Status: AC
Start: 2023-12-20 — End: 2023-12-20
  Filled 2023-12-20: qty 20

## 2023-12-20 MED ORDER — KETOROLAC 10 MG TABLET
10.0000 mg | ORAL_TABLET | Freq: Four times a day (QID) | ORAL | 0 refills | Status: AC | PRN
Start: 2023-12-20 — End: ?

## 2023-12-20 MED ORDER — LIDOCAINE HCL 10 MG/ML (1 %) INJECTION SOLUTION
1.0000 g | Freq: Once | INTRAMUSCULAR | Status: AC
Start: 2023-12-20 — End: 2023-12-20
  Administered 2023-12-20: 1 g via INTRAMUSCULAR

## 2023-12-20 MED ORDER — CLINDAMYCIN HCL 300 MG CAPSULE
300.0000 mg | ORAL_CAPSULE | Freq: Four times a day (QID) | ORAL | 0 refills | Status: AC
Start: 2023-12-20 — End: 2023-12-30

## 2023-12-20 MED ORDER — CEFTRIAXONE 1 GRAM SOLUTION FOR INJECTION
INTRAMUSCULAR | Status: AC
Start: 2023-12-20 — End: 2023-12-20
  Filled 2023-12-20: qty 10

## 2023-12-20 NOTE — ED Provider Notes (Signed)
 [facility], [place] - Emergency Department  ED Primary Note  History of Present Illness   Albert Ward is a 53 y.o. male who had concerns including Toothache. Pt states dental pain off and on for weeks. Worse for a few days. Trying to get dental apt. Denies diff swallowing nor fever  Review of Systems   Constitutional: No fever, chills or weakness   Skin: No rash or diaphoresis  HENT: No headaches, or congestion+ dental pain .  Eyes: No vision changes or photophobia   Cardio: No chest pain, palpitations or leg swelling   Respiratory: No cough, wheezing or SOB  GI:  No nausea, vomiting or stool changes  GU:  No dysuria, hematuria, or increased frequency  MSK: No muscle aches, joint or back pain  Neuro: No seizures, LOC, numbness, tingling, or focal weakness  Psychiatric: No depression, SI or substance abuse  All other systems reviewed and are negative.      Physical Exam   ED Triage Vitals [12/20/23 1154]   BP (Non-Invasive) (!) 137/92   Heart Rate 50   Respiratory Rate 18   Temperature 36.3 C (97.4 F)   SpO2 100 %   Weight 72.6 kg (160 lb)   Height 1.829 m (6')     Constitutional:  53 y.o. male who appears in no distress. Normal color, no cyanosis.   HENT:   Head: Normocephalic and atraumatic.   Mouth/Throat: Oropharynx is clear and moist. Rt lower last 3 molars caries to gum line   Eyes: EOMI, PERRL   Neck: Trachea midline. Neck supple.  Cardiovascular: RRR, No murmurs, rubs or gallops. Intact distal pulses.  Pulmonary/Chest: BS equal bilaterally. No respiratory distress. No wheezes, rales or chest tenderness.   Abdominal: Bowel sounds present and normal. Abdomen soft, no tenderness, no rebound and no guarding.  Back: No midline spinal tenderness, no paraspinal tenderness, no CVA tenderness.           Musculoskeletal: No edema, tenderness or deformity.  Skin: warm and dry. No rash, erythema, pallor or cyanosis  Psychiatric: normal mood and affect. Behavior is normal.   Neurological: Patient  keenly alert and responsive, easily able to raise eyebrows, facial muscles/expressions symmetric, speaking in fluent sentences, moving all extremities equally and fully, normal gait  Patient Data   Labs Ordered/Reviewed - No data to display  No orders to display     Medical Decision Making   Diff dx of dental abscess. Dental pain. Caries.  Pt has full ROM of mouth no edema. Encouraged close follow up. Voiced understanding.             Medications Ordered/Administered in the ED   cefTRIAXone  (ROCEPHIN ) 1 g in lidocaine 2.86 mL (tot vol) IM injection (1 g IntraMUSCULAR Given 12/20/23 1231)   ketorolac (TORADOL) 60mg /2 mL IM injection (60 mg IntraMUSCULAR Given 12/20/23 1231)     Clinical Impression   Dental caries (Primary)   Dentalgia       Disposition: Discharged

## 2023-12-20 NOTE — ED Nurses Note (Signed)
 Patient discharged home.  AVS reviewed with patient/care giver.  A written copy of the AVS and discharge instructions was given to the patient/care giver. Prescriptions discussed with patient/care giver. Questions sufficiently answered as needed.  Patient/care giver encouraged to follow up with PCP as indicated.  In the event of an emergency, patient/care giver instructed to call 911 or go to the nearest emergency room.

## 2024-01-12 ENCOUNTER — Other Ambulatory Visit: Payer: Self-pay

## 2024-01-17 ENCOUNTER — Other Ambulatory Visit: Payer: Self-pay

## 2024-01-19 ENCOUNTER — Emergency Department (HOSPITAL_BASED_OUTPATIENT_CLINIC_OR_DEPARTMENT_OTHER)

## 2024-01-19 ENCOUNTER — Encounter (HOSPITAL_BASED_OUTPATIENT_CLINIC_OR_DEPARTMENT_OTHER): Payer: Self-pay | Admitting: Emergency Medicine

## 2024-01-19 ENCOUNTER — Emergency Department (HOSPITAL_BASED_OUTPATIENT_CLINIC_OR_DEPARTMENT_OTHER)
Admission: EM | Admit: 2024-01-19 | Discharge: 2024-01-19 | Disposition: A | Discharge status: Home / Self Care | Attending: Emergency Medicine | Admitting: Emergency Medicine

## 2024-01-19 ENCOUNTER — Other Ambulatory Visit: Payer: Self-pay

## 2024-01-19 DIAGNOSIS — K8080 Other cholelithiasis without obstruction: Secondary | ICD-10-CM | POA: Insufficient documentation

## 2024-01-19 DIAGNOSIS — Z21 Asymptomatic human immunodeficiency virus [HIV] infection status: Secondary | ICD-10-CM | POA: Insufficient documentation

## 2024-01-19 DIAGNOSIS — R1013 Epigastric pain: Secondary | ICD-10-CM | POA: Diagnosis present

## 2024-01-19 DIAGNOSIS — I10 Essential (primary) hypertension: Secondary | ICD-10-CM | POA: Diagnosis not present

## 2024-01-19 DIAGNOSIS — R109 Unspecified abdominal pain: Secondary | ICD-10-CM

## 2024-01-19 LAB — CBC WITH DIFFERENTIAL/PLATELET
Abs Immature Granulocytes: 0.06 10*3/uL (ref 0.00–0.07)
Basophils Absolute: 0 10*3/uL (ref 0.0–0.1)
Basophils Relative: 0 %
Eosinophils Absolute: 0 10*3/uL (ref 0.0–0.5)
Eosinophils Relative: 0 %
HCT: 46.1 % (ref 39.0–52.0)
Hemoglobin: 16.8 g/dL (ref 13.0–17.0)
Immature Granulocytes: 1 %
Lymphocytes Relative: 4 %
Lymphs Abs: 0.5 10*3/uL — ABNORMAL LOW (ref 0.7–4.0)
MCH: 33.5 pg (ref 26.0–34.0)
MCHC: 36.4 g/dL — ABNORMAL HIGH (ref 30.0–36.0)
MCV: 92 fL (ref 80.0–100.0)
Monocytes Absolute: 0.8 10*3/uL (ref 0.1–1.0)
Monocytes Relative: 7 %
Neutro Abs: 10.5 10*3/uL — ABNORMAL HIGH (ref 1.7–7.7)
Neutrophils Relative %: 88 %
Platelets: 244 10*3/uL (ref 150–400)
RBC: 5.01 MIL/uL (ref 4.22–5.81)
RDW: 13.2 % (ref 11.5–15.5)
WBC: 11.9 10*3/uL — ABNORMAL HIGH (ref 4.0–10.5)
nRBC: 0 % (ref 0.0–0.2)

## 2024-01-19 LAB — COMPREHENSIVE METABOLIC PANEL WITH GFR
ALT: 29 U/L (ref 0–44)
AST: 29 U/L (ref 15–41)
Albumin: 4.7 g/dL (ref 3.5–5.0)
Alkaline Phosphatase: 78 U/L (ref 38–126)
Anion gap: 9 (ref 5–15)
BUN: 19 mg/dL (ref 6–20)
CO2: 28 mmol/L (ref 22–32)
Calcium: 9.8 mg/dL (ref 8.9–10.3)
Chloride: 101 mmol/L (ref 98–111)
Creatinine, Ser: 0.93 mg/dL (ref 0.61–1.24)
GFR, Estimated: >60 mL/min (ref >60)
Glucose, Bld: 112 mg/dL — ABNORMAL HIGH (ref 70–99)
Potassium: 3.9 mmol/L (ref 3.5–5.1)
Sodium: 138 mmol/L (ref 135–145)
Total Bilirubin: 1 mg/dL (ref 0.0–1.2)
Total Protein: 7.5 g/dL (ref 6.5–8.1)

## 2024-01-19 LAB — LIPASE, BLOOD: Lipase: 51 U/L (ref 11–51)

## 2024-01-19 MED ORDER — PANTOPRAZOLE SODIUM 40 MG IV SOLR
80.0000 mg | Freq: Once | INTRAVENOUS | Status: AC
  Administered 2024-01-19: 80 mg via INTRAVENOUS
  Filled 2024-01-19: qty 20

## 2024-01-19 MED ORDER — MORPHINE SULFATE (PF) 4 MG/ML IV SOLN
4.0000 mg | Freq: Once | INTRAVENOUS | Status: AC
  Administered 2024-01-19: 4 mg via INTRAVENOUS
  Filled 2024-01-19: qty 1

## 2024-01-19 MED ORDER — PANTOPRAZOLE SODIUM 40 MG PO TBEC: 40.0000 mg | DELAYED_RELEASE_TABLET | Freq: Every day | ORAL | 0 refills | Status: DC

## 2024-01-19 MED ORDER — ONDANSETRON HCL 4 MG/2ML IJ SOLN
4.0000 mg | Freq: Once | INTRAMUSCULAR | Status: AC
  Administered 2024-01-19: 4 mg via INTRAVENOUS
  Filled 2024-01-19: qty 2

## 2024-01-19 MED ORDER — ONDANSETRON HCL 4 MG PO TABS: 4.0000 mg | ORAL_TABLET | ORAL | 0 refills | Status: DC | PRN

## 2024-01-19 MED ORDER — DIPHENHYDRAMINE HCL 50 MG/ML IJ SOLN
INTRAMUSCULAR | Status: AC
  Administered 2024-01-19: 25 mg
  Filled 2024-01-19: qty 1

## 2024-01-19 MED ORDER — OXYCODONE HCL 5 MG PO TABS: 5.0000 mg | ORAL_TABLET | Freq: Four times a day (QID) | ORAL | 0 refills | Status: DC | PRN

## 2024-01-19 MED ORDER — ACETAMINOPHEN 325 MG PO TABS: 650.0000 mg | ORAL_TABLET | Freq: Four times a day (QID) | ORAL | 0 refills | Status: DC | PRN

## 2024-01-19 MED ORDER — IOHEXOL 300 MG/ML  SOLN
100.0000 mL | Freq: Once | INTRAMUSCULAR | Status: AC | PRN
  Administered 2024-01-19: 100 mL via INTRAVENOUS

## 2024-01-19 NOTE — ED Provider Notes (Signed)
  Provider Note MRN:  [id number]  Arrival date & time: 01/19/24    ED Course and Medical Decision Making  Assumed care from Dr Charlyn at shift change.  See note from prior team for complete details, in brief:  Clinical Course as of 01/19/24 1923  Thu Jan 19, 2024  1335 Nursing staff informed me that patient started feeling hot after he received morphine.  He was given Benadryl by the nursing staff. I went to assess the patient.  He states that he started getting hot, but did not have any itching, nausea or rash.  He has already received Benadryl, my suspicion is that he just had sensitivity to morphine and not a side effect. [AN]  1559 Handoff AN 52 yo/m Hx HIV, prior appendectomy Upper abd pain today Left/epig CT w/ mesenteric fat stranding/cholelithiasis RUQ U/S pending  [SG]  1654 RUQ u/s with cholelithiasis w/o cholecystitis [SG]  1701 Feeling better, tolerating po [SG]    Clinical Course User Index [AN] Charlyn Sora, MD [SG] Elnor Jayson LABOR, DO   Patient is also worried about tick bite, this was around 2 weeks ago.  He has no bull's-eye/targetoid rash.  Tick was not engorged.  Low suspicion for Lyme disease or tickborne illness at this time. He is feeling better, tolerant p.o. intake, no further nausea or vomiting. Discussed treatment options regarding cholelithiasis.  First follow-up in the office of general surgery.  Will discharge home with dietary changes, antiemetics, analgesics, PPI. Strict return precautions emphasized  Patient in no distress and overall condition is stable. Detailed discussions were had with the patient/guardian regarding current findings, and need for close f/u with PCP or on call doctor. The patient/guardian has been instructed to return immediately if the symptoms worsen in any way for re-evaluation. Patient/guardian verbalized understanding and is in agreement with current care plan. All questions answered prior to  discharge.   Procedures  Final Clinical Impressions(s) / ED Diagnoses     ICD-10-CM   1. Abdominal pain, unspecified abdominal location  R10.9     2. 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  Please follow-up with your PCP next week.  Please also follow-up with general surgery in the next 2 weeks.  Be sure to eat a very bland diet the next 2 weeks, avoid fatty foods or fried foods.  Please return to the emergency department for any worsening or worrisome symptoms.        Elnor Jayson LABOR, DO 01/19/24 Tommy Galvan

## 2024-01-19 NOTE — ED Notes (Signed)
 RN reviewed discharge instructions with pt. Pt verbalized understanding and had no further questions. VSS upon discharge.

## 2024-01-19 NOTE — ED Triage Notes (Signed)
 Pt caox4, ambulatory, NAD c/o N/V and abd pain today and fatigue lately stating he is concerned because approx 2 weeks ago he was bit by a tick which he brought to the ED.

## 2024-01-19 NOTE — ED Provider Notes (Signed)
 [place] EMERGENCY DEPARTMENT AT [facility] Provider Note   CSN: [id number] Arrival date & time: 01/19/24  1152     Patient presents with: Emesis   Tommy Galvan is a 53 y.o. male.   HPI    53 year old male comes in with chief complaint of abdominal pain.  Patient has history of HIV, hypertension.  He states that he started having abdominal pain in the epigastric region this morning.  Pain was moderate to severe at onset, it is nonradiating and described as sharp pain.  No history of similar pain.  Patient has had nausea with emesis x 2 today.  He denies any diarrhea and his last BM was today.  Prior to Admission medications   Medication Sig Start Date End Date Taking? Authorizing Provider  acetaminophen  (TYLENOL ) 325 MG tablet Take 2 tablets (650 mg total) by mouth every 6 (six) hours as needed. 01/19/24   Elnor Jayson LABOR, DO  bictegravir-emtricitabine -tenofovir  AF (BIKTARVY ) 50-200-25 MG TABS tablet Take 1 tablet by mouth daily. 03/23/23   Fleeta Kathie Jomarie LOISE, MD  MOUNJARO 2.5 MG/0.5ML Pen Inject into the skin once a week. 04/05/22   [provider]  ondansetron  (ZOFRAN ) 4 MG tablet Take 1 tablet (4 mg total) by mouth every 4 (four) hours as needed for nausea or vomiting. 01/19/24   Elnor Jayson LABOR, DO  oxyCODONE  (ROXICODONE ) 5 MG immediate release tablet Take 1 tablet (5 mg total) by mouth every 6 (six) hours as needed. 01/19/24   Elnor Jayson LABOR, DO  pantoprazole  (PROTONIX ) 40 MG tablet Take 1 tablet (40 mg total) by mouth daily for 14 days. 01/19/24 02/02/24  Elnor Jayson A, DO  Pitavastatin  Magnesium  4 MG TABS Take1 tablet (4 mg) by mouth daily. 03/23/23   Fleeta Kathie Jomarie LOISE, MD    Allergies: Penicillins    Review of Systems  All other systems reviewed and are negative.   Updated Vital Signs BP 109/61   Pulse (!) 106   Temp 98.2 F (36.8 C) (Oral)   Resp 20   Ht 5' 11 (1.803 m)   Wt 77.1 kg   SpO2 100%   BMI 23.71 kg/m   Physical Exam Vitals and nursing note  reviewed.  Constitutional:      Appearance: He is well-developed.  HENT:     Head: Atraumatic.  Cardiovascular:     Rate and Rhythm: Normal rate.  Pulmonary:     Effort: Pulmonary effort is normal.  Abdominal:     Tenderness: There is abdominal tenderness.     Comments: Upper quadrant abdominal tenderness without any rebound  Musculoskeletal:     Cervical back: Neck supple.  Skin:    General: Skin is warm.  Neurological:     Mental Status: He is alert and oriented to person, place, and time.     (all labs ordered are listed, but only abnormal results are displayed) Labs Reviewed  COMPREHENSIVE METABOLIC PANEL WITH GFR - Abnormal; Notable for the following components:      Result Value   Glucose, Bld 112 (*)    All other components within normal limits  CBC WITH DIFFERENTIAL/PLATELET - Abnormal; Notable for the following components:   WBC 11.9 (*)    MCHC 36.4 (*)    Neutro Abs 10.5 (*)    Lymphs Abs 0.5 (*)    All other components within normal limits  LIPASE, BLOOD    EKG: None  Radiology: US  Abdomen Limited RUQ (LIVER/GB) Result Date: 01/19/2024 CLINICAL DATA:  Right upper quadrant abdominal pain. EXAM: ULTRASOUND ABDOMEN LIMITED RIGHT UPPER QUADRANT COMPARISON:  CT dated 01/19/2024. FINDINGS: Gallbladder: Multiple gallstones. No gallbladder wall thickening or pericholecystic fluid. Negative sonographic Murphy's sign. Common bile duct: Diameter: 4 mm. Liver: No focal lesion identified. Within normal limits in parenchymal echogenicity. Portal vein is patent on color Doppler imaging with normal direction of blood flow towards the liver. Other: None. IMPRESSION: Cholelithiasis without sonographic evidence of acute cholecystitis. Electronically Signed   By: Vanetta Chou M.D.   On: 01/19/2024 16:40   CT ABDOMEN PELVIS W CONTRAST Result Date: 01/19/2024 CLINICAL DATA:  Abdominal pain and fatigue, history of tick bite EXAM: CT ABDOMEN AND PELVIS WITH CONTRAST TECHNIQUE:  Multidetector CT imaging of the abdomen and pelvis was performed using the standard protocol following bolus administration of intravenous contrast. RADIATION DOSE REDUCTION: This exam was performed according to the departmental dose-optimization program which includes automated exposure control, adjustment of the mA and/or kV according to patient size and/or use of iterative reconstruction technique. CONTRAST:  OMNIPAQUE  IOHEXOL  300 MG/ML  SOLN COMPARISON:  CT abdomen pelvis Nov 26, 2005 (report only, images not available for comparison) FINDINGS: Lower chest: Right middle lobe micronodule (3/2). Hepatobiliary: No focal liver abnormality is seen. Focal fat in segment 4 . Cholelithiasis. No gallbladder wall thickening, or biliary dilatation. Pancreas: Unremarkable. No pancreatic ductal dilatation or surrounding inflammatory changes. Spleen: Normal in size without focal abnormality. Adrenals/Urinary Tract: Bilateral simple renal cortical cysts which does not require imaging follow-up. No hydronephrosis or nephrolithiasis. Normal adrenal glands. Stomach/Bowel: Stomach is within normal limits. No evidence of bowel wall thickening, distention, or inflammatory changes. Vascular/Lymphatic: Few subcentimeter mesenteric lymph nodes, not enlarged by CT size criteria. Mild mesenteric fat stranding. No significant vascular findings are present. Otherwise no enlarged abdominal or pelvic lymph nodes. Reproductive: Prostate is unremarkable. Other: Tiny fat containing left inguinal hernia . No abdominopelvic ascites. Musculoskeletal: No acute or significant osseous findings. IMPRESSION: Mesenteric root prominent but subcentimeter short axis lymph nodes with associated mesenteric fat stranding, indeterminate, likely reactive (infectious/inflammatory). Recommend comparison with prior imaging and correlation with clinical findings. Right middle lobe micronodule. No imaging follow-up required in a low risk patient. Few simple  renal cortical cysts ,which does not require imaging follow-up. Cholelithiasis is suspected. Recommend ultrasound for further assessment. Electronically Signed   By: Megan  Zare M.D.   On: 01/19/2024 14:41     Procedures   Medications Ordered in the ED  morphine  (PF) 4 MG/ML injection 4 mg (4 mg Intravenous Given 01/19/24 1235)  ondansetron  (ZOFRAN ) injection 4 mg (4 mg Intravenous Given 01/19/24 1234)  pantoprazole  (PROTONIX ) injection 80 mg (80 mg Intravenous Given 01/19/24 1234)  diphenhydrAMINE  (BENADRYL ) 50 MG/ML injection (25 mg  Given 01/19/24 1240)  iohexol  (OMNIPAQUE ) 300 MG/ML solution 100 mL (100 mLs Intravenous Contrast Given 01/19/24 1338)    Clinical Course as of 01/20/24 0751  Thu Jan 19, 2024  1335 Nursing staff informed me that patient started feeling hot after he received morphine .  He was given Benadryl  by the nursing staff. I went to assess the patient.  He states that he started getting hot, but did not have any itching, nausea or rash.  He has already received Benadryl , my suspicion is that he just had sensitivity to morphine  and not a side effect. [AN]  1559 Handoff AN 52 yo/m Hx HIV, prior appendectomy Upper abd pain today Left/epig CT w/ mesenteric fat stranding/cholelithiasis RUQ U/S pending  [SG]  1654 RUQ u/s with cholelithiasis w/o  cholecystitis [SG]  1701 Feeling better, tolerating po [SG]    Clinical Course User Index [AN] Charlyn Sora, MD [SG] Elnor Jayson LABOR, DO                                 Medical Decision Making Amount and/or Complexity of Data Reviewed Labs: ordered. Radiology: ordered. ECG/medicine tests: ordered.  Risk OTC drugs. Prescription drug management.   This patient presents to the ED with chief complaint(s) of abdominal pain with pertinent past medical history of HIV, hypertension.patient has surgical history of appendectomy.The complaint involves an extensive differential diagnosis and also carries with it a high risk of  complications and morbidity.    The differential diagnosis includes : Early small bowel obstruction, cholecystitis, pancreatitis, intra-abdominal infection  The initial plan is to get basic labs and CT abdomen and pelvis with contrast.   Additional history obtained: Records reviewed infectious disease notes  Independent labs interpretation:  The following labs were independently interpreted: CBC, CMP, lipase are normal.  Independent visualization and interpretation of imaging: - I independently visualized the following imaging with scope of interpretation limited to determining acute life threatening conditions related to emergency care: CT scan of the abdomen, which revealed no evidence of free air.  However, there is concerns for cholelithiasis. Ultrasound has been ordered.  Depending on ultrasound, anticipate discharge with outpatient follow-up.  Final diagnoses:  Biliary calculus of other site without obstruction  Abdominal pain, unspecified abdominal location    ED Discharge Orders          Ordered    oxyCODONE  (ROXICODONE ) 5 MG immediate release tablet  Every 6 hours PRN,   Status:  Discontinued        01/19/24 1921    pantoprazole  (PROTONIX ) 40 MG tablet  Daily,   Status:  Discontinued        01/19/24 1921    acetaminophen  (TYLENOL ) 325 MG tablet  Every 6 hours PRN,   Status:  Discontinued        01/19/24 1921    ondansetron  (ZOFRAN ) 4 MG tablet  Every 4 hours PRN,   Status:  Discontinued        01/19/24 1921    oxyCODONE  (ROXICODONE ) 5 MG immediate release tablet  Every 6 hours PRN        01/19/24 2000    pantoprazole  (PROTONIX ) 40 MG tablet  Daily        01/19/24 2000    acetaminophen  (TYLENOL ) 325 MG tablet  Every 6 hours PRN        01/19/24 2000    ondansetron  (ZOFRAN ) 4 MG tablet  Every 4 hours PRN        01/19/24 2000               Charlyn Sora, MD 01/20/24 [phone]

## 2024-01-19 NOTE — Discharge Instructions (Addendum)
 It was a pleasure caring for you today in the emergency department.  Please follow-up with your PCP next week.  Please also follow-up with general surgery in the next 2 weeks.  Be sure to eat a very bland diet the next 2 weeks, avoid fatty foods or fried foods.  Please return to the emergency department for any worsening or worrisome symptoms.

## 2024-01-26 ENCOUNTER — Other Ambulatory Visit: Payer: Self-pay | Admitting: Pharmacy Technician

## 2024-01-26 ENCOUNTER — Encounter (INDEPENDENT_AMBULATORY_CARE_PROVIDER_SITE_OTHER): Payer: Self-pay

## 2024-01-26 ENCOUNTER — Other Ambulatory Visit: Payer: Self-pay

## 2024-01-26 NOTE — Progress Notes (Signed)
 Specialty Pharmacy Refill Coordination Note  Tommy Galvan is a 53 y.o. male contacted today regarding refills of specialty medication(s)   Bictegravir-Emtricitab-Tenofov (Biktarvy )    Patient requested (Patient-Rptd) Pickup at [name] at [facility] date: (Patient-Rptd) 02/02/24   Medication will be filled on 02/01/24.

## 2024-01-27 ENCOUNTER — Other Ambulatory Visit (HOSPITAL_COMMUNITY): Payer: Self-pay

## 2024-02-01 ENCOUNTER — Other Ambulatory Visit: Payer: Self-pay

## 2024-04-03 ENCOUNTER — Other Ambulatory Visit: Payer: Self-pay

## 2024-04-03 DIAGNOSIS — B20 Human immunodeficiency virus [HIV] disease: Secondary | ICD-10-CM

## 2024-04-03 DIAGNOSIS — Z113 Encounter for screening for infections with a predominantly sexual mode of transmission: Secondary | ICD-10-CM

## 2024-04-11 ENCOUNTER — Other Ambulatory Visit (HOSPITAL_COMMUNITY)
Admission: RE | Admit: 2024-04-11 | Discharge: 2024-04-11 | Disposition: A | Discharge status: Home / Self Care | Source: Ambulatory Visit | Attending: Infectious Disease | Admitting: Infectious Disease

## 2024-04-11 ENCOUNTER — Other Ambulatory Visit

## 2024-04-11 ENCOUNTER — Other Ambulatory Visit: Payer: Self-pay

## 2024-04-11 DIAGNOSIS — Z113 Encounter for screening for infections with a predominantly sexual mode of transmission: Secondary | ICD-10-CM | POA: Insufficient documentation

## 2024-04-11 DIAGNOSIS — B20 Human immunodeficiency virus [HIV] disease: Secondary | ICD-10-CM

## 2024-04-12 LAB — T-HELPER CELL (CD4) - (RCID CLINIC ONLY)
CD4 % Helper T Cell: 25 % — ABNORMAL LOW (ref 33–65)
CD4 T Cell Abs: 386 /uL — ABNORMAL LOW (ref 400–1790)

## 2024-04-12 LAB — URINE CYTOLOGY ANCILLARY ONLY
Chlamydia: NEGATIVE
Comment: NEGATIVE
Comment: NORMAL
Neisseria Gonorrhea: NEGATIVE

## 2024-04-14 LAB — LIPID PANEL
Cholesterol: 106 mg/dL (ref <200)
HDL: 55 mg/dL (ref >=40)
LDL Cholesterol (Calc): 35 mg/dL
Non-HDL Cholesterol (Calc): 51 mg/dL (ref <130)
Total CHOL/HDL Ratio: 1.9 (calc) (ref <5.0)
Triglycerides: 77 mg/dL (ref <150)

## 2024-04-14 LAB — COMPLETE METABOLIC PANEL WITHOUT GFR
AG Ratio: 1.7 (calc) (ref 1.0–2.5)
ALT: 32 U/L (ref 9–46)
AST: 29 U/L (ref 10–35)
Albumin: 4.6 g/dL (ref 3.6–5.1)
Alkaline phosphatase (APISO): 61 U/L (ref 35–144)
BUN: 19 mg/dL (ref 7–25)
CO2: 30 mmol/L (ref 20–32)
Calcium: 9.7 mg/dL (ref 8.6–10.3)
Chloride: 103 mmol/L (ref 98–110)
Creat: 0.97 mg/dL (ref 0.70–1.30)
Globulin: 2.7 g/dL (ref 1.9–3.7)
Glucose, Bld: 93 mg/dL (ref 65–99)
Potassium: 4.1 mmol/L (ref 3.5–5.3)
Sodium: 140 mmol/L (ref 135–146)
Total Bilirubin: 1 mg/dL (ref 0.2–1.2)
Total Protein: 7.3 g/dL (ref 6.1–8.1)

## 2024-04-14 LAB — CBC WITH DIFFERENTIAL/PLATELET
Absolute Lymphocytes: 1678 {cells}/uL (ref 850–3900)
Absolute Monocytes: 418 {cells}/uL (ref 200–950)
Basophils Absolute: 61 {cells}/uL (ref 0–200)
Basophils Relative: 1.3 %
Eosinophils Absolute: 89 {cells}/uL (ref 15–500)
Eosinophils Relative: 1.9 %
HCT: 45.1 % (ref 38.5–50.0)
Hemoglobin: 15.5 g/dL (ref 13.2–17.1)
MCH: 33.3 pg — ABNORMAL HIGH (ref 27.0–33.0)
MCHC: 34.4 g/dL (ref 32.0–36.0)
MCV: 96.8 fL (ref 80.0–100.0)
MPV: 10.1 fL (ref 7.5–12.5)
Monocytes Relative: 8.9 %
Neutro Abs: 2453 {cells}/uL (ref 1500–7800)
Neutrophils Relative %: 52.2 %
Platelets: 247 Thousand/uL (ref 140–400)
RBC: 4.66 Million/uL (ref 4.20–5.80)
RDW: 13.3 % (ref 11.0–15.0)
Total Lymphocyte: 35.7 %
WBC: 4.7 Thousand/uL (ref 3.8–10.8)

## 2024-04-14 LAB — RPR: RPR Ser Ql: NONREACTIVE

## 2024-04-14 LAB — HIV-1 RNA QUANT-NO REFLEX-BLD
HIV 1 RNA Quant: NOT DETECTED {copies}/mL
HIV-1 RNA Quant, Log: NOT DETECTED {Log_copies}/mL

## 2024-04-17 NOTE — Progress Notes (Unsigned)
   Subjective:  Chief complaint: follow-up for HIV disease on medications   Patient ID: Tommy Galvan, male    DOB: 1971/03/31, 53 y.o.   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    Past Surgical History:  Procedure Laterality Date   APPENDECTOMY      Family History  Problem Relation Age of Onset   Cancer Father    Hyperlipidemia Father    Mental illness Brother    Diabetes Maternal Grandmother    Hyperlipidemia Maternal Grandfather    Hypertension Maternal Grandfather    Stroke Maternal Grandfather    Cancer Maternal Grandfather    Heart disease Paternal Grandmother    Hyperlipidemia Paternal Grandmother    Hypertension Paternal Grandmother    Stroke Paternal Grandmother    Cancer Paternal Grandfather       Social History   Socioeconomic History   Marital status: Single    Spouse name: Not on file   Number of children: Not on file   Years of education: Not on file   Highest education level: Not on file  Occupational History   Not on file  Tobacco Use   Smoking status: Never   Smokeless tobacco: Never  Substance and Sexual Activity   Alcohol use: No   Drug use: No   Sexual activity: Yes    Partners: Male    Comment: declined condoms  Other Topics Concern   Not on file  Social History Narrative   Not on file   Social Drivers of Health   Financial Resource Strain: Not on file  Food Insecurity: Not on file  Transportation Needs: Not on file  Physical Activity: Not on file  Stress: Not on file  Social Connections: Not on file    Allergies  Allergen Reactions   Penicillins     REACTION: Reaction unknown; remote     Current Outpatient Medications:    acetaminophen  (TYLENOL ) 325 MG tablet, Take  2 tablets (650 mg total) by mouth every 6 (six) hours as needed., Disp: 36 tablet, Rfl: 0   bictegravir-emtricitabine -tenofovir  AF (BIKTARVY ) 50-200-25 MG TABS tablet, Take 1 tablet by mouth daily., Disp: 90 tablet, Rfl: 3   MOUNJARO 2.5 MG/0.5ML Pen, Inject into the skin once a week., Disp: , Rfl:    ondansetron  (ZOFRAN ) 4 MG tablet, Take 1 tablet (4 mg total) by mouth every 4 (four) hours as needed for nausea or vomiting., Disp: 12 tablet, Rfl: 0   oxyCODONE  (ROXICODONE ) 5 MG immediate release tablet, Take 1 tablet (5 mg total) by mouth every 6 (six) hours as needed., Disp: 10 tablet, Rfl: 0   pantoprazole  (PROTONIX ) 40 MG tablet, Take 1 tablet (40 mg total) by mouth daily for 14 days., Disp: 14 tablet, Rfl: 0   Pitavastatin  Magnesium  4 MG TABS, Take1 tablet (4 mg) by mouth daily., Disp: 30 tablet, Rfl: 11   Review of Systems     Objective:   Physical Exam        Assessment & Plan:

## 2024-04-18 ENCOUNTER — Other Ambulatory Visit: Payer: Self-pay | Admitting: Infectious Disease

## 2024-04-18 ENCOUNTER — Other Ambulatory Visit: Payer: Self-pay

## 2024-04-18 ENCOUNTER — Other Ambulatory Visit (HOSPITAL_COMMUNITY): Payer: Self-pay

## 2024-04-18 ENCOUNTER — Ambulatory Visit (INDEPENDENT_AMBULATORY_CARE_PROVIDER_SITE_OTHER): Payer: Self-pay | Admitting: Infectious Disease

## 2024-04-18 VITALS — BP 125/79 | HR 74 | Temp 97.6°F | Wt 174.0 lb

## 2024-04-18 DIAGNOSIS — B20 Human immunodeficiency virus [HIV] disease: Secondary | ICD-10-CM

## 2024-04-18 DIAGNOSIS — Z7185 Encounter for immunization safety counseling: Secondary | ICD-10-CM | POA: Diagnosis not present

## 2024-04-18 DIAGNOSIS — G9349 Other encephalopathy: Secondary | ICD-10-CM

## 2024-04-18 DIAGNOSIS — E785 Hyperlipidemia, unspecified: Secondary | ICD-10-CM | POA: Diagnosis not present

## 2024-04-18 MED ORDER — BIKTARVY 50-200-25 MG PO TABS
1.0000 | ORAL_TABLET | Freq: Every day | ORAL | 3 refills | Status: AC
  Filled 2024-04-18 – 2024-04-25 (×2): qty 90, 90d supply, fill #0
  Filled 2024-06-20 – 2024-07-18 (×2): qty 90, 90d supply, fill #1

## 2024-04-18 MED ORDER — ATORVASTATIN CALCIUM 20 MG PO TABS
20.0000 mg | ORAL_TABLET | Freq: Every day | ORAL | 11 refills | Status: AC
  Filled 2024-04-18: qty 30, 30d supply, fill #0
  Filled 2024-05-21: qty 30, 30d supply, fill #1
  Filled 2024-06-20: qty 30, 30d supply, fill #2

## 2024-04-18 MED ORDER — PITAVASTATIN MAGNESIUM 4 MG PO TABS
4.0000 mg | ORAL_TABLET | Freq: Every day | ORAL | 11 refills | Status: DC
  Filled 2024-04-18: qty 90, 90d supply, fill #0

## 2024-04-25 ENCOUNTER — Other Ambulatory Visit: Payer: Self-pay

## 2024-04-25 ENCOUNTER — Encounter (INDEPENDENT_AMBULATORY_CARE_PROVIDER_SITE_OTHER): Payer: Self-pay

## 2024-04-25 ENCOUNTER — Other Ambulatory Visit (HOSPITAL_COMMUNITY): Payer: Self-pay

## 2024-04-25 NOTE — Progress Notes (Signed)
 Specialty Pharmacy Refill Coordination Note  Tommy Galvan is a 53 y.o. male contacted today regarding refills of specialty medication(s) Bictegravir-Emtricitab-Tenofov (Biktarvy )   Patient requested [name] at [facility] at [place] date: 04/25/24   Medication will be filled on 10/8.

## 2024-04-26 ENCOUNTER — Other Ambulatory Visit (HOSPITAL_COMMUNITY): Payer: Self-pay

## 2024-04-27 ENCOUNTER — Other Ambulatory Visit: Payer: Self-pay

## 2024-04-27 NOTE — Progress Notes (Signed)
 Specialty Pharmacy Ongoing Clinical Assessment Note  Tommy Galvan is a 53 y.o. male who is being followed by the specialty pharmacy service for RxSp HIV   Patient's specialty medication(s) reviewed today: Bictegravir-Emtricitab-Tenofov (Biktarvy )   Missed doses in the last 4 weeks: 0   Patient/Caregiver did not have any additional questions or concerns.   Therapeutic benefit summary: Patient is achieving benefit   Adverse events/side effects summary: No adverse events/side effects   Patient's therapy is appropriate to: Continue    Goals Addressed             This Visit's Progress    Achieve Undetectable HIV Viral Load < 20   On track    Patient is on track. Patient will maintain adherence. Patient's viral load remains undetectable long term         Follow up: 12 months  [facility]

## 2024-06-20 ENCOUNTER — Other Ambulatory Visit (HOSPITAL_COMMUNITY): Payer: Self-pay

## 2024-07-10 ENCOUNTER — Other Ambulatory Visit (HOSPITAL_COMMUNITY): Payer: Self-pay

## 2024-07-10 MED ORDER — OSELTAMIVIR PHOSPHATE 75 MG PO CAPS
75.0000 mg | ORAL_CAPSULE | Freq: Two times a day (BID) | ORAL | 0 refills | Status: AC
  Filled 2024-07-10: qty 10, 5d supply, fill #0

## 2024-07-18 ENCOUNTER — Other Ambulatory Visit (HOSPITAL_COMMUNITY): Payer: Self-pay

## 2024-07-23 ENCOUNTER — Other Ambulatory Visit: Payer: Self-pay

## 2024-07-23 ENCOUNTER — Other Ambulatory Visit (HOSPITAL_COMMUNITY): Payer: Self-pay

## 2024-07-23 NOTE — Progress Notes (Signed)
 Specialty Pharmacy Refill Coordination Note  Tommy Galvan is a 54 y.o. male contacted today regarding refills of specialty medication(s) Bictegravir-Emtricitab-Tenofov (Biktarvy )   Patient requested Delivery   Delivery date: 07/30/24   Verified address: [address] [place], [address]   Medication will be filled on: 07/27/24

## 2024-07-27 ENCOUNTER — Other Ambulatory Visit: Payer: Self-pay
